# Patient Record
Sex: Female | Born: 1980 | Race: White | Hispanic: No | Marital: Married | Smoking: Never smoker
Health system: Southern US, Community
[De-identification: ages and names within clinical notes are randomized; demographics above are authoritative.]

## PROBLEM LIST (undated history)

## (undated) DIAGNOSIS — E119 Type 2 diabetes mellitus without complications: Secondary | ICD-10-CM

## (undated) HISTORY — PX: TUBAL LIGATION: SHX77

## (undated) HISTORY — PX: CHOLECYSTECTOMY: SHX55

---

## 2004-01-26 ENCOUNTER — Other Ambulatory Visit: Payer: Self-pay

## 2005-09-05 ENCOUNTER — Emergency Department: Payer: Self-pay | Admitting: Emergency Medicine

## 2006-01-19 ENCOUNTER — Observation Stay: Payer: Self-pay

## 2006-02-17 ENCOUNTER — Emergency Department: Payer: Self-pay | Admitting: Emergency Medicine

## 2006-02-17 ENCOUNTER — Observation Stay: Payer: Self-pay | Admitting: Unknown Physician Specialty

## 2006-03-08 ENCOUNTER — Observation Stay: Payer: Self-pay

## 2006-03-21 ENCOUNTER — Observation Stay: Payer: Self-pay | Admitting: Unknown Physician Specialty

## 2006-04-02 ENCOUNTER — Inpatient Hospital Stay: Payer: Self-pay | Admitting: Unknown Physician Specialty

## 2007-10-18 ENCOUNTER — Ambulatory Visit: Payer: Self-pay | Admitting: Obstetrics & Gynecology

## 2007-10-28 ENCOUNTER — Encounter: Payer: Self-pay | Admitting: Maternal & Fetal Medicine

## 2007-11-04 ENCOUNTER — Encounter: Payer: Self-pay | Admitting: Maternal & Fetal Medicine

## 2007-11-11 ENCOUNTER — Encounter: Payer: Self-pay | Admitting: Obstetrics and Gynecology

## 2007-11-15 ENCOUNTER — Observation Stay: Payer: Self-pay

## 2007-11-18 ENCOUNTER — Encounter: Payer: Self-pay | Admitting: Maternal & Fetal Medicine

## 2007-11-25 ENCOUNTER — Encounter: Payer: Self-pay | Admitting: Maternal & Fetal Medicine

## 2007-11-26 ENCOUNTER — Ambulatory Visit: Payer: Self-pay | Admitting: Obstetrics & Gynecology

## 2007-11-29 ENCOUNTER — Observation Stay: Payer: Self-pay | Admitting: Obstetrics and Gynecology

## 2007-11-30 ENCOUNTER — Inpatient Hospital Stay: Payer: Self-pay | Admitting: Obstetrics & Gynecology

## 2014-09-21 ENCOUNTER — Ambulatory Visit: Payer: Self-pay | Admitting: Bariatrics

## 2016-07-26 ENCOUNTER — Emergency Department
Admission: EM | Admit: 2016-07-26 | Discharge: 2016-07-26 | Disposition: A | Discharge status: Home / Self Care | Payer: Managed Care, Other (non HMO) | Attending: Emergency Medicine | Admitting: Emergency Medicine

## 2016-07-26 ENCOUNTER — Encounter: Payer: Self-pay | Admitting: Emergency Medicine

## 2016-07-26 DIAGNOSIS — S39012A Strain of muscle, fascia and tendon of lower back, initial encounter: Secondary | ICD-10-CM

## 2016-07-26 DIAGNOSIS — Y9389 Activity, other specified: Secondary | ICD-10-CM | POA: Diagnosis not present

## 2016-07-26 DIAGNOSIS — Y929 Unspecified place or not applicable: Secondary | ICD-10-CM | POA: Diagnosis not present

## 2016-07-26 DIAGNOSIS — S29002A Unspecified injury of muscle and tendon of back wall of thorax, initial encounter: Secondary | ICD-10-CM | POA: Diagnosis present

## 2016-07-26 DIAGNOSIS — R809 Proteinuria, unspecified: Secondary | ICD-10-CM | POA: Diagnosis not present

## 2016-07-26 DIAGNOSIS — E119 Type 2 diabetes mellitus without complications: Secondary | ICD-10-CM | POA: Insufficient documentation

## 2016-07-26 DIAGNOSIS — S29012A Strain of muscle and tendon of back wall of thorax, initial encounter: Secondary | ICD-10-CM | POA: Diagnosis not present

## 2016-07-26 DIAGNOSIS — X58XXXA Exposure to other specified factors, initial encounter: Secondary | ICD-10-CM | POA: Insufficient documentation

## 2016-07-26 DIAGNOSIS — Y999 Unspecified external cause status: Secondary | ICD-10-CM | POA: Diagnosis not present

## 2016-07-26 HISTORY — DX: Type 2 diabetes mellitus without complications: E11.9

## 2016-07-26 LAB — URINALYSIS, ROUTINE W REFLEX MICROSCOPIC
BILIRUBIN URINE: NEGATIVE
Glucose, UA: 50 mg/dL — AB
KETONES UR: NEGATIVE mg/dL
LEUKOCYTES UA: NEGATIVE
Nitrite: NEGATIVE
Specific Gravity, Urine: 1.01 (ref 1.005–1.030)
pH: 6 (ref 5.0–8.0)

## 2016-07-26 MED ORDER — CYCLOBENZAPRINE HCL 10 MG PO TABS: 10.0000 mg | ORAL_TABLET | Freq: Three times a day (TID) | ORAL | 0 refills | Status: DC | PRN

## 2016-07-26 NOTE — Discharge Instructions (Signed)
Please follow-up with your primary care provider next week for further assessment of proteinuria and regular Diabetes follow-up.

## 2016-07-26 NOTE — ED Provider Notes (Signed)
[facility] Emergency Department Provider Note  ____________________________________________  Time seen: Approximately 7:01 PM  I have reviewed the triage vital signs and the nursing notes.   HISTORY  Chief Complaint Back Pain    HPI Rebecca Martin is a 36 y.o. female , NAD, presents to the emergency department for evaluation of back pain. States she had onset of back pain approximately 3 hours ago while she was completing housework. States she took ibuprofen earlier today for headache and did not want to take anything else on top of that medication and she was concerned about medication side effects. Denies any significant changes in her activities nor any new activities.Denies traumas or falls. Has had no numbness, weakness, tingling, saddle paresthesias nor loss of bowel or bladder control. Denies any abdominal pain, nausea, vomiting, diarrhea, dysuria, hematuria, increased urinary frequency or onset of urinary hesitancy. Patient notes she is a diabetic but unfortunately does not take her medications daily as prescribed and only checks her blood sugars occasionally. Last A1c was greater than 10 and she is due to follow-up with her primary care provider this month to discuss further options of glucose control to include insulin and possible weight loss surgery.   Past Medical History:  Diagnosis Date  . Diabetes mellitus without complication (HCC)     There are no active problems to display for this patient.   Past Surgical History:  Procedure Laterality Date  . CESAREAN SECTION    . CHOLECYSTECTOMY    . TUBAL LIGATION      Prior to Admission medications   Medication Sig Start Date End Date Taking? Authorizing Provider  cyclobenzaprine (FLEXERIL) 10 MG tablet Take 1 tablet (10 mg total) by mouth 3 (three) times daily as needed for muscle spasms. 07/26/16   Jami L Hagler, PA-C    Allergies Penicillins  No family history on file.  Social  History Social History  Substance Use Topics  . Smoking status: Never Smoker  . Smokeless tobacco: Not on file  . Alcohol use Not on file     Review of Systems Constitutional: No fever/chills Eyes: No visual changes.  Cardiovascular: No chest pain. Respiratory: No shortness of breath. No wheezing.  Gastrointestinal: No abdominal pain.  No nausea, vomiting.  No diarrhea.   Genitourinary: Negative for dysuria. No hematuria. No urinary hesitancy, urgency or increased frequency. Musculoskeletal: Positive for back pain.  Skin: Negative for rash, skin sores. Neurological: Negative for headaches, focal weakness or numbness. No tingling. No saddle paresthesias or loss of bowel or bladder control. 10-point ROS otherwise negative.  ____________________________________________   PHYSICAL EXAM:  VITAL SIGNS: ED Triage Vitals  Enc Vitals Group     BP 07/26/16 1809 (!) 169/95     Pulse Rate 07/26/16 1809 72     Resp 07/26/16 1809 20     Temp 07/26/16 1809 97.7 F (36.5 C)     Temp Source 07/26/16 1809 Oral     SpO2 07/26/16 1809 98 %     Weight 07/26/16 1811 260 lb (117.9 kg)     Height 07/26/16 1811 5\' 7"  (1.702 m)     Head Circumference --      Peak Flow --      Pain Score 07/26/16 1811 7     Pain Loc --      Pain Edu? --      Excl. in GC? --     Constitutional: Alert and oriented. Well appearing and in no acute distress. Eyes: Conjunctivae  are normal.  Head: Atraumatic. Neck: Supple with full range of motion. No cervical spine tenderness to palpation. Hematological/Lymphatic/Immunilogical: No cervical lymphadenopathy. Cardiovascular: Normal rate, regular rhythm. Normal S1 and S2.  Good peripheral circulation. Respiratory: Normal respiratory effort without tachypnea or retractions. Lungs CTAB with breath sounds noted in all lung fields. No wheeze, LOC, rales. Musculoskeletal: Full range of motion of lumbar spine without pain or difficulty. No SI joint tenderness bilaterally.  Negative straight leg raise bilaterally. No tenderness to palpation of the thoracic or lumbar Central spinal regions and no step-offs or deformities noted. No lower extremity tenderness nor edema.  No joint effusions. Neurologic:  Normal speech and language. No gross focal neurologic deficits are appreciated.  Skin:  Skin is warm, dry and intact. No rash, redness, swelling, bruising, skin sores noted. Psychiatric: Mood and affect are normal. Speech and behavior are normal. Patient exhibits appropriate insight and judgement.   ____________________________________________   LABS (all labs ordered are listed, but only abnormal results are displayed)  Labs Reviewed  URINALYSIS, ROUTINE W REFLEX MICROSCOPIC - Abnormal; Notable for the following:       Result Value   Color, Urine YELLOW (*)    APPearance CLEAR (*)    Glucose, UA 50 (*)    Hgb urine dipstick MODERATE (*)    Protein, ur >=300 (*)    Bacteria, UA RARE (*)    Squamous Epithelial / LPF 0-5 (*)    All other components within normal limits   ____________________________________________  EKG  None ____________________________________________  RADIOLOGY  None ____________________________________________    PROCEDURES  Procedure(s) performed: None   Procedures   Medications - No data to display   ____________________________________________   INITIAL IMPRESSION / ASSESSMENT AND PLAN / ED COURSE  Pertinent labs & imaging results that were available during my care of the patient were reviewed by me and considered in my medical decision making (see chart for details).  Clinical Course as of Jul 26 2252  Sat Jul 26, 2016  1918 Long discussion had with the patient in regards to urinalysis results and need for compliance with diabetic medications and monitoring. Patient notes that she does not take her diabetic medications on a daily basis and will only take her blood sugars if she feels unwell. Did check her  fasting sugar yesterday which was 218 in which she states is about average for her sugars on a regular basis. Also notes that she regularly follows up every 3 months with her primary care provider and is actually due this month for her recheck. States that her last blood draw in August 2017 revealed normal CBC and metabolic panel. Has had no renal disease or dysfunction in the past. Uncertain if she's ever had a urinalysis completed to assess for proteinuria. Also notes that her LMP began on January 20 and she has continued to spot through today which would account for the moderate hemoglobin in the urine. Patient was offered further evaluation to include lab work and potential CT to evaluate the kidneys but she declines at this time. She states she would prefer to follow-up with her primary care provider to have lab work and imaging is necessary. Did again encourage the patient to be compliant with medication regimen. I am comfortable with discharging the patient home with follow-up with her primary care provider as she seems reliable and well versed in her medical history. Strict return precautions were discussed with the patient in regards to worsening or new onset of symptoms to require  emergency department follow-up.  [JH]    Clinical Course User Index [JH] Jami L Hagler, PA-C    Patient's diagnosis is consistent with Back strain and proteinuria. Patient will be discharged home with prescriptions for Flexeril to take as directed. Patient also advised to continue 600 mg of ibuprofen every 6-8 hours as needed for back pain. Advise patient to complete light range of motion and stretching exercises. Patient is to follow up with her primary care provider for regular diabetic medication management and evaluation as well as if symptoms persist past this treatment course. Patient is given ED precautions to return to the ED for any worsening or new symptoms.     ____________________________________________  FINAL CLINICAL IMPRESSION(S) / ED DIAGNOSES  Final diagnoses:  Back strain, initial encounter  Proteinuria, unspecified type      NEW MEDICATIONS STARTED DURING THIS VISIT:  Discharge Medication List as of 07/26/2016  7:22 PM    START taking these medications   Details  cyclobenzaprine (FLEXERIL) 10 MG tablet Take 1 tablet (10 mg total) by mouth 3 (three) times daily as needed for muscle spasms., Starting Sat 07/26/2016, Print             Ernestene KielJami L ElginHagler, PA-C 07/26/16 2255    Emily FilbertJonathan E Williams, MD 07/26/16 [phone]

## 2016-07-26 NOTE — ED Triage Notes (Signed)
Low back pain began 3 hours ago. No injury, no dysuria.

## 2016-09-05 ENCOUNTER — Other Ambulatory Visit: Payer: Self-pay | Admitting: General Surgery

## 2016-09-12 ENCOUNTER — Ambulatory Visit
Admission: RE | Admit: 2016-09-12 | Discharge: 2016-09-12 | Disposition: A | Discharge status: Home / Self Care | Payer: Managed Care, Other (non HMO) | Source: Ambulatory Visit | Attending: General Surgery | Admitting: General Surgery

## 2016-09-12 ENCOUNTER — Other Ambulatory Visit
Admission: RE | Admit: 2016-09-12 | Discharge: 2016-09-12 | Disposition: A | Discharge status: Home / Self Care | Payer: Managed Care, Other (non HMO) | Source: Ambulatory Visit | Attending: General Surgery | Admitting: General Surgery

## 2016-09-12 DIAGNOSIS — Z01812 Encounter for preprocedural laboratory examination: Secondary | ICD-10-CM | POA: Diagnosis not present

## 2016-09-12 DIAGNOSIS — Z01810 Encounter for preprocedural cardiovascular examination: Secondary | ICD-10-CM | POA: Insufficient documentation

## 2016-09-12 DIAGNOSIS — I498 Other specified cardiac arrhythmias: Secondary | ICD-10-CM | POA: Insufficient documentation

## 2016-09-12 LAB — COMPREHENSIVE METABOLIC PANEL
ALK PHOS: 93 U/L (ref 38–126)
ALT: 18 U/L (ref 14–54)
ANION GAP: 6 (ref 5–15)
AST: 15 U/L (ref 15–41)
Albumin: 4 g/dL (ref 3.5–5.0)
BUN: 15 mg/dL (ref 6–20)
CALCIUM: 8.5 mg/dL — AB (ref 8.9–10.3)
CO2: 25 mmol/L (ref 22–32)
Chloride: 102 mmol/L (ref 101–111)
Creatinine, Ser: 0.55 mg/dL (ref 0.44–1.00)
GFR calc non Af Amer: >60 mL/min (ref >60)
Glucose, Bld: 261 mg/dL — ABNORMAL HIGH (ref 65–99)
Potassium: 4.1 mmol/L (ref 3.5–5.1)
SODIUM: 133 mmol/L — AB (ref 135–145)
TOTAL PROTEIN: 7.7 g/dL (ref 6.5–8.1)
Total Bilirubin: 1.3 mg/dL — ABNORMAL HIGH (ref 0.3–1.2)

## 2016-09-12 LAB — CBC WITH DIFFERENTIAL/PLATELET
Basophils Absolute: 0.1 10*3/uL (ref 0–0.1)
Basophils Relative: 1 %
EOS ABS: 0.2 10*3/uL (ref 0–0.7)
Eosinophils Relative: 2 %
HCT: 37 % (ref 35.0–47.0)
Hemoglobin: 12.7 g/dL (ref 12.0–16.0)
LYMPHS ABS: 2.8 10*3/uL (ref 1.0–3.6)
Lymphocytes Relative: 25 %
MCH: 27.4 pg (ref 26.0–34.0)
MCHC: 34.4 g/dL (ref 32.0–36.0)
MCV: 79.5 fL — ABNORMAL LOW (ref 80.0–100.0)
Monocytes Absolute: 0.4 10*3/uL (ref 0.2–0.9)
Monocytes Relative: 4 %
Neutro Abs: 7.9 10*3/uL — ABNORMAL HIGH (ref 1.4–6.5)
Neutrophils Relative %: 68 %
PLATELETS: 339 10*3/uL (ref 150–440)
RBC: 4.65 MIL/uL (ref 3.80–5.20)
RDW: 14.2 % (ref 11.5–14.5)
WBC: 11.4 10*3/uL — AB (ref 3.6–11.0)

## 2016-09-12 LAB — VITAMIN B12: Vitamin B-12: 388 pg/mL (ref 180–914)

## 2016-09-12 LAB — TSH: TSH: 1.799 u[IU]/mL (ref 0.350–4.500)

## 2016-09-12 LAB — IRON: Iron: 53 ug/dL (ref 28–170)

## 2016-09-13 LAB — HEMOGLOBIN A1C
Hgb A1c MFr Bld: 9 % — ABNORMAL HIGH (ref 4.8–5.6)
MEAN PLASMA GLUCOSE: 212 mg/dL

## 2016-09-14 LAB — H.PYLORI BREATH TEST (REFLEX): H. PYLORI BREATH TEST: NEGATIVE

## 2016-09-14 LAB — H. PYLORI BREATH TEST

## 2016-09-15 LAB — CALCITRIOL (1,25 DI-OH VIT D): Vit D, 1,25-Dihydroxy: 51.6 pg/mL (ref 19.9–79.3)

## 2016-10-08 ENCOUNTER — Encounter: Payer: Managed Care, Other (non HMO) | Attending: General Surgery | Admitting: Skilled Nursing Facility1

## 2016-10-08 ENCOUNTER — Encounter: Payer: Self-pay | Admitting: Skilled Nursing Facility1

## 2016-10-08 DIAGNOSIS — E119 Type 2 diabetes mellitus without complications: Secondary | ICD-10-CM | POA: Diagnosis not present

## 2016-10-08 DIAGNOSIS — Z7984 Long term (current) use of oral hypoglycemic drugs: Secondary | ICD-10-CM | POA: Insufficient documentation

## 2016-10-08 DIAGNOSIS — Z6841 Body Mass Index (BMI) 40.0 and over, adult: Secondary | ICD-10-CM | POA: Insufficient documentation

## 2016-10-08 DIAGNOSIS — Z713 Dietary counseling and surveillance: Secondary | ICD-10-CM | POA: Diagnosis not present

## 2016-10-08 NOTE — Patient Instructions (Addendum)
-   Check blood sugar at least once a day. Keep a log of blood sugar numbers.   - Practice not drinking 15 minutes before, during, and 30 minutes after each meal/snack  - Avoid all carbonated beverages

## 2016-10-08 NOTE — Progress Notes (Signed)
Pre-Op Assessment Visit:  Pre-Operative Sleeve gastrectomy Surgery  Patient was seen on 10/08/2016 for Pre-Operative Nutrition Assessment. Assessment and letter of approval faxed to [facility] Bariatric Surgery Program coordinator on 10/08/2016.  Pt expectation of surgery: to have managed diabetes A1c <7,   Pt expectation of Dietitian: needs creative substitutions for picky eating  Start weight at [facility]: 256.6# BMI: 40.84  Pt states motivated to be able to do and be active with children while they play. Pt states that she may consider not having the surgery if she can control her diabetes. Pt states she Doesn't like eggs, cheese, cooked spinach, peas, most greens, asparagus, and pinto beans. Pt states she is willing to try different foods except eggs and cheese. Pt was happy to receive diabetes education. Pt is an Systems developer and likes science/numbers.    24 hr Dietary Recall: First Meal: Premier protein Snack:  oikos triple zero Second Meal: salad, wrap, sandwich, chips, wheat thins Snack: baked chips, fruit Third Meal: spaghetti, chicken pie, sloppy joe, chicken  Snack: popcorn, chips Beverages: water (plain, flavored, sparkling), coffee, sugar-free creamer  Encouraged to engage in  minutes of moderate physical activity including cardiovascular and weight baring weekly Goals: - Check blood sugar at least once a day. Keep a log of blood sugar numbers.  - Practice not drinking 15 minutes before, during, and 30 minutes after each meal/snack - Avoid all carbonated beverages  Handouts given during visit include:  . Pre-Op Goals . Bariatric Surgery Protein Shakes During the appointment today the following Pre-Op Goals were reviewed with the patient: . Maintain or lose weight as instructed by your surgeon . Make healthy food choices . Begin to limit portion sizes . Limited concentrated sugars and fried foods . Keep fat/sugar in the single digits per serving on           food  labels . Practice CHEWING your food  (aim for 30 chews per bite or until applesauce consistency) . Practice not drinking 15 minutes before, during, and 30 minutes after each meal/snack . Avoid all carbonated beverages  . Avoid/limit caffeinated beverages  . Avoid all sugar-sweetened beverages . Consume 3 meals per day; eat every 3-5 hours . Make a list of non-food related activities . Aim for 64-100 ounces of FLUID daily  . Aim for at least 60-80 grams of PROTEIN daily . Look for a liquid protein source that contain ?15 g protein and ?5 g carbohydrate  (ex: shakes, drinks, shots)  -Follow diet recommendations listed below   Energy and Macronutrient Recomendations: Calories: 1800 Carbohydrate: 200 Protein: 135 Fat: 50  Demonstrated degree of understanding via:  Teach Back  Teaching Method Utilized:  Visual Auditory Hands on  Barriers to learning/adherence to lifestyle change: none identified   Patient to call the [facility] to enroll in Pre-Op and Post-Op Nutrition Education when surgery date is scheduled.

## 2016-11-12 ENCOUNTER — Ambulatory Visit: Payer: Managed Care, Other (non HMO) | Admitting: Skilled Nursing Facility1

## 2016-11-18 ENCOUNTER — Ambulatory Visit (INDEPENDENT_AMBULATORY_CARE_PROVIDER_SITE_OTHER): Payer: Managed Care, Other (non HMO) | Admitting: Psychiatry

## 2016-11-18 ENCOUNTER — Encounter: Payer: Self-pay | Admitting: Registered"

## 2016-11-18 ENCOUNTER — Encounter: Payer: Managed Care, Other (non HMO) | Attending: General Surgery | Admitting: Registered"

## 2016-11-18 DIAGNOSIS — Z6841 Body Mass Index (BMI) 40.0 and over, adult: Secondary | ICD-10-CM | POA: Diagnosis not present

## 2016-11-18 DIAGNOSIS — E119 Type 2 diabetes mellitus without complications: Secondary | ICD-10-CM | POA: Insufficient documentation

## 2016-11-18 DIAGNOSIS — F509 Eating disorder, unspecified: Secondary | ICD-10-CM | POA: Diagnosis not present

## 2016-11-18 DIAGNOSIS — Z713 Dietary counseling and surveillance: Secondary | ICD-10-CM | POA: Diagnosis not present

## 2016-11-18 DIAGNOSIS — Z7984 Long term (current) use of oral hypoglycemic drugs: Secondary | ICD-10-CM | POA: Diagnosis not present

## 2016-11-18 NOTE — Progress Notes (Signed)
Appt start time: 12:07 end time: 12:24  Assessment: 1st SWL Appointment.   Start Wt at [facility]: 256.6 Wt: 255.5 BMI: 40.62   Pt arrives having lost 1.1 lbs from previous visit.   Pt states she has cut back on carbonation and drinking  90% water. Pt states she she has cut out frying food. Pt reports having an 37yo son that she's concerned about weight issues and wants to create better habits for him and the rest of the family as well. Pt states she has been working on not drinking with meals. Pt states she checks her BS 3 times a wk: FBS (around 200).  MEDICATIONS: See list   DIETARY INTAKE:  24-hr recall:  B ( AM): Protein shake  Snk ( AM): greek yogurt  L ( PM): sandwich, wrap, salad, with fruit Snk ( PM): none D ( PM): spaghetti and salad Snk ( PM): crackers (sometimes) Beverages: water, sparkling water   Usual physical activity: walk occasionally  Diet to Follow: Calories: 1800 Carbohydrate: 200 Protein: 135 Fat: 50  Preferred Learning Style:   No preference indicated   Learning Readiness:   Contemplating  Ready  Change in progress     Nutritional Diagnosis:  -3.3 Overweight/obesity related to past poor dietary habits and physical inactivity as evidenced by patient w/ planned sleeve gastrectomy surgery following dietary guidelines for continued weight loss.    Intervention:  Nutrition counseling for upcoming Bariatric Surgery.  Goals:  - Aim to check blood sugar at least once a day: fasting and 2 hrs after meals. Keep meter on you. - Increase physical activity to at least 15 min a day. Wear fitness watch and set alarms to tell you to move throughout your day.  Teaching Method Utilized:  Visual Auditory   Handouts given during visit include:  none   Barriers to learning/adherence to lifestyle change: none  Demonstrated degree of understanding via:  Teach Back   Monitoring/Evaluation:  Dietary intake, exercise, and body weight in 1 month(s).

## 2016-11-18 NOTE — Patient Instructions (Addendum)
-   Aim to check blood sugar at least once a day: fasting and 2 hrs after meals. Keep meter on you.  - Increase physical activity to at least 15 min a day. Wear fitness watch and set alarms to tell you to move throughout your day.

## 2016-12-16 ENCOUNTER — Ambulatory Visit: Payer: Managed Care, Other (non HMO) | Admitting: Psychiatry

## 2016-12-19 ENCOUNTER — Encounter: Payer: Managed Care, Other (non HMO) | Attending: General Surgery | Admitting: Registered"

## 2016-12-19 ENCOUNTER — Encounter: Payer: Self-pay | Admitting: Registered"

## 2016-12-19 DIAGNOSIS — Z6841 Body Mass Index (BMI) 40.0 and over, adult: Secondary | ICD-10-CM | POA: Diagnosis not present

## 2016-12-19 DIAGNOSIS — Z713 Dietary counseling and surveillance: Secondary | ICD-10-CM | POA: Insufficient documentation

## 2016-12-19 DIAGNOSIS — Z7984 Long term (current) use of oral hypoglycemic drugs: Secondary | ICD-10-CM | POA: Insufficient documentation

## 2016-12-19 DIAGNOSIS — E119 Type 2 diabetes mellitus without complications: Secondary | ICD-10-CM | POA: Insufficient documentation

## 2016-12-19 NOTE — Patient Instructions (Addendum)
-   Aim to check blood sugar at least 3-4 times a day: fasting and 2 hours after meals. Track numbers.   - Find a fitness class that you enjoy while schedule is lighter this summer in addition to walking routine.

## 2016-12-19 NOTE — Progress Notes (Signed)
Appt start time: 11:05 end time: 11:25  Assessment: 2nd SWL Appointment.   Start Wt at [facility]: 256.6 Wt: 257.4 BMI: 40.92   Pt arrives having gained 1.9 lbs from previous visit. Pt states she checks BS at least 2x times/day: FBS (160-170; sometimes 200) after meals (250). Pt states she has reduced fried food and grills/bakes meat.Pt states portion control is not good at night. Pt reports her family (has 4 children) likes apples and fruit especially since they are in season now. Pt reports she is working on increasing walking and trying to sign up for fitness/cardio-dance class during summer break. Pt states she is still undecided about surgery and would like to continue with nutrition visits due to accountability and motivation.   Pt states she may have 1 more required visit with us.     MEDICATIONS: See list   DIETARY INTAKE:  24-hr recall:  B ( AM): Protein shake or triple zero yogurt Snk ( AM): greek yogurt  L ( PM): sandwich, wrap, salad, with fruit Snk ( PM): none D ( PM): meat, mashed potatoes, and vegetables  Snk ( PM): crackers, fruit, greek yogurt Beverages: water, sparkling water   Usual physical activity: walks occasionally  Diet to Follow: Calories: 1800 Carbohydrate: 200 Protein: 135 Fat: 50  Preferred Learning Style:   No preference indicated   Learning Readiness:   Contemplating  Ready  Change in progress     Nutritional Diagnosis:  [place]-3.3 Overweight/obesity related to past poor dietary habits and physical inactivity as evidenced by patient w/ planned sleeve gastrectomy surgery following dietary guidelines for continued weight loss.    Intervention:  Nutrition counseling for upcoming Bariatric Surgery.  Goals:  - Aim to check blood sugar at least 3-4 times a day: fasting and 2 hours after meals. Track numbers.  - Find a fitness class that you enjoy while schedule is lighter this summer in addition to walking routine.  Teaching Method Utilized:   Visual Auditory   Handouts given during visit include:  none   Barriers to learning/adherence to lifestyle change: none  Demonstrated degree of understanding via:  Teach Back   Monitoring/Evaluation:  Dietary intake, exercise, and body weight in 1 month(s).

## 2017-01-16 ENCOUNTER — Ambulatory Visit: Payer: Self-pay | Admitting: Registered"

## 2017-01-16 NOTE — Progress Notes (Deleted)
Appt start time: 11:05 end time: ***  Assessment: 3rd SWL Appointment.   Start Wt at [facility]: 256.6 Wt: 257.4,  BMI: 40.92   Pt arrives having gained 1.9 lbs from previous visit. Pt states she checks BS at least 2x times/day: FBS (160-170; sometimes 200) after meals (250). Pt states she has reduced fried food and grills/bakes meat.Pt states portion control is not good at night. Pt reports her family (has 4 children) likes apples and fruit especially since they are in season now. Pt reports she is working on increasing walking and trying to sign up for fitness/cardio-dance class during summer break. Pt states she is still undecided about surgery and would like to continue with nutrition visits due to accountability and motivation.   Pt states she may have 1 more required visit with us.     MEDICATIONS: See list   DIETARY INTAKE:  24-hr recall:  B ( AM): Protein shake or triple zero yogurt Snk ( AM): greek yogurt  L ( PM): sandwich, wrap, salad, with fruit Snk ( PM): none D ( PM): meat, mashed potatoes, and vegetables  Snk ( PM): crackers, fruit, greek yogurt Beverages: water, sparkling water   Usual physical activity: walks occasionally  Diet to Follow: Calories: 1800 Carbohydrate: 200 Protein: 135 Fat: 50  Preferred Learning Style:   No preference indicated   Learning Readiness:   Contemplating  Ready  Change in progress     Nutritional Diagnosis:  [place]-3.3 Overweight/obesity related to past poor dietary habits and physical inactivity as evidenced by patient w/ planned sleeve gastrectomy surgery following dietary guidelines for continued weight loss.    Intervention:  Nutrition counseling for upcoming Bariatric Surgery.  Goals:  - Aim to check blood sugar at least 3-4 times a day: fasting and 2 hours after meals. Track numbers.  - Find a fitness class that you enjoy while schedule is lighter this summer in addition to walking routine.  Teaching Method Utilized:   Visual Auditory   Handouts given during visit include:  none   Barriers to learning/adherence to lifestyle change: none  Demonstrated degree of understanding via:  Teach Back   Monitoring/Evaluation:  Dietary intake, exercise, and body weight in 1 month(s).

## 2017-01-21 ENCOUNTER — Encounter: Payer: Managed Care, Other (non HMO) | Attending: General Surgery | Admitting: Registered"

## 2017-01-21 ENCOUNTER — Encounter: Payer: Self-pay | Admitting: Registered"

## 2017-01-21 DIAGNOSIS — Z713 Dietary counseling and surveillance: Secondary | ICD-10-CM | POA: Diagnosis not present

## 2017-01-21 DIAGNOSIS — E119 Type 2 diabetes mellitus without complications: Secondary | ICD-10-CM | POA: Insufficient documentation

## 2017-01-21 DIAGNOSIS — Z7984 Long term (current) use of oral hypoglycemic drugs: Secondary | ICD-10-CM | POA: Insufficient documentation

## 2017-01-21 DIAGNOSIS — Z6841 Body Mass Index (BMI) 40.0 and over, adult: Secondary | ICD-10-CM | POA: Insufficient documentation

## 2017-01-21 NOTE — Progress Notes (Signed)
Appt start time: 8:35 end time: 8:50  Assessment: 3rd SWL Appointment.   Start Wt at [facility]: 256.6 Wt: 257.7 BMI: 40.92   Pt arrives having maintained weight from previous visit.  Pt states she checks BS at least 2x times/day: FBS (190-200) after meals (225). Pt states work has been busy lately with system going down. Pt reports she does not eat regularly; higher BS numbers during the day. Pt states her lunch over the past few weeks she will only eat 1/2 salad, then rush to do some work and eat 1/2 salad later. Pt reports she has been stress-eating recently; eats wheat thins instead of cookies and cakes. Pt states she has looked into fitness centers and plans to [facility] in the next couple of weeks. Pt states she has eliminated carbonation; no sparkling water. Pt states she has decided to go through with surgery because she has reached a plateau and unable to lose weight on her own.   This is pt's last visit with us prior to surgery.    MEDICATIONS: See list   DIETARY INTAKE:  24-hr recall:  B ( AM): Protein shake or triple zero yogurt Snk ( AM): greek yogurt  L ( PM): sandwich, wrap, salad w/ grilled chicken, with fruit Snk ( PM): none D ( PM): meat, mashed potatoes, and vegetables  Snk ( PM): crackers, fruit, greek yogurt Beverages: water  Usual physical activity: walks occasionally  Diet to Follow: Calories: 1800 Carbohydrate: 200 Protein: 135 Fat: 50  Preferred Learning Style:   No preference indicated   Learning Readiness:   Contemplating  Ready  Change in progress     Nutritional Diagnosis:  [place]-3.3 Overweight/obesity related to past poor dietary habits and physical inactivity as evidenced by patient w/ planned sleeve gastrectomy surgery following dietary guidelines for continued weight loss.    Intervention:  Nutrition counseling for upcoming Bariatric Surgery.  Goals:  - Reach out to co-worker to incorporate walking during work day.  - Aim to have  physical activity at least 30 min/day, 5 days a week.  - Continue to aim to chew well; at least 30 times per bite or to applesauce consistency. - Aim to not drink with meals. No drinking 15 min prior to eating, not while eating, and waiting 30 after eating to drink.   Teaching Method Utilized:  Visual Auditory   Handouts given during visit include:  101 Things to Do Besides Eat   Barriers to learning/adherence to lifestyle change: none  Demonstrated degree of understanding via:  Teach Back   Monitoring/Evaluation:  Dietary intake, exercise, and body weight prn.

## 2017-01-21 NOTE — Patient Instructions (Addendum)
-   Reach out to co-worker to incorporate walking during work day.   - Aim to have physical activity at least 30 min/day, 5 days a week.   - Continue to aim to chew well; at least 30 times per bite or to applesauce consistency.  - Aim to not drink with meals. No drinking 15 min prior to eating, not while eating, and waiting 30 after eating to drink.

## 2017-02-02 ENCOUNTER — Ambulatory Visit: Payer: Self-pay | Admitting: Skilled Nursing Facility1

## 2017-02-13 ENCOUNTER — Ambulatory Visit: Payer: Self-pay | Admitting: General Surgery

## 2017-02-13 NOTE — Progress Notes (Signed)
Need orders in epic for 8-27 surgery

## 2017-02-16 ENCOUNTER — Encounter: Payer: Managed Care, Other (non HMO) | Attending: General Surgery | Admitting: Registered"

## 2017-02-16 ENCOUNTER — Ambulatory Visit: Payer: Self-pay | Admitting: General Surgery

## 2017-02-16 DIAGNOSIS — Z713 Dietary counseling and surveillance: Secondary | ICD-10-CM | POA: Insufficient documentation

## 2017-02-16 DIAGNOSIS — E119 Type 2 diabetes mellitus without complications: Secondary | ICD-10-CM | POA: Diagnosis not present

## 2017-02-16 DIAGNOSIS — Z6841 Body Mass Index (BMI) 40.0 and over, adult: Secondary | ICD-10-CM | POA: Diagnosis not present

## 2017-02-16 DIAGNOSIS — E669 Obesity, unspecified: Secondary | ICD-10-CM

## 2017-02-16 DIAGNOSIS — Z7984 Long term (current) use of oral hypoglycemic drugs: Secondary | ICD-10-CM | POA: Insufficient documentation

## 2017-02-16 NOTE — Progress Notes (Signed)
  Pre-Operative Nutrition Class:  Appt start time: 8:15  End time:  9:15  Patient was seen on 02/16/2017 for Pre-Operative Bariatric Surgery [facility]   Surgery date: 02/24/2017 Surgery type: Sleeve gastrectomy Start weight at [facility]: 256.6 Weight today: 257.1   Samples given per MNT protocol. Patient educated on appropriate usage: Bariatric Advantage Multivitamin Lot # [id number] Exp: 11/2017  Bariatric Advantage Calcium Citrate Lot # [id number] Exp: 03/25/2017  Renee Pain Protein Powder Lot # [id number] Exp: 08/2016  The following the learning objectives were met by the patient during this course:  Identify Pre-Op Dietary Goals and will begin 2 weeks pre-operatively  Identify appropriate sources of fluids and proteins   State protein recommendations and appropriate sources pre and post-operatively  Identify Post-Operative Dietary Goals and will follow for 2 weeks post-operatively  Identify appropriate multivitamin and calcium sources  Describe the need for physical activity post-operatively and will follow MD recommendations  State when to call healthcare provider regarding medication questions or post-operative complications  Handouts given during class include:  Pre-Op Bariatric Surgery Diet Handout  Protein Shake Handout  Post-Op Bariatric Surgery Nutrition Handout  [facility] Program Information Flyer  Support Group Information Flyer  [facility] Bariatric Supplements Price List  Follow-Up Plan: Patient will follow-up at [facility] 2 weeks post operatively for diet advancement per MD.

## 2017-02-19 NOTE — Patient Instructions (Addendum)
Rebecca Martin  02/19/2017   Your procedure is scheduled on: 02-24-17  Report to [facility] Main  Entrance Take [place]  elevators to 3rd floor to  Short Stay Center at 8:45AM.    Call this number if you have problems the morning of surgery [phone]    Remember: ONLY 1 PERSON MAY GO WITH YOU TO SHORT STAY TO GET  READY MORNING OF YOUR SURGERY.  Do not eat food or drink liquids :After Midnight.     Take these medicines the morning of surgery with A SIP OF WATER: tylenol if needed                                You may not have any metal on your body including hair pins and              piercings  Do not wear jewelry, make-up, lotions, powders or perfumes, deodorant             Do not wear nail polish.  Do not shave  48 hours prior to surgery.             Do not bring valuables to the hospital. [facility] IS NOT             RESPONSIBLE   FOR VALUABLES.  Contacts, dentures or bridgework may not be worn into surgery.  Leave suitcase in the car. After surgery it may be brought to your room.                Please read over the following fact sheets you were given: _____________________________________________________________________  How to Manage Your Diabetes Before and After Surgery  Why is it important to control my blood sugar before and after surgery? . Improving blood sugar levels before and after surgery helps healing and can limit problems. . A way of improving blood sugar control is eating a healthy diet by: o  Eating less sugar and carbohydrates o  Increasing activity/exercise o  Talking with your doctor about reaching your blood sugar goals . High blood sugars (greater than 180 mg/dL) can raise your risk of infections and slow your recovery, so you will need to focus on controlling your diabetes during the weeks before surgery. . Make sure that the doctor who takes care of your diabetes knows about your planned surgery including the date and  location.  How do I manage my blood sugar before surgery? . Check your blood sugar at least 4 times a day, starting 2 days before surgery, to make sure that the level is not too high or low. o Check your blood sugar the morning of your surgery when you wake up and every 2 hours until you get to the Short Stay unit. . If your blood sugar is less than 70 mg/dL, you will need to treat for low blood sugar: o Do not take insulin. o Treat a low blood sugar (less than 70 mg/dL) with  cup of clear juice (cranberry or apple), 4 glucose tablets, OR glucose gel. o Recheck blood sugar in 15 minutes after treatment (to make sure it is greater than 70 mg/dL). If your blood sugar is not greater than 70 mg/dL on recheck, call [phone] for further instructions. . Report your blood sugar to the short stay nurse when you get to Short Stay.  Marland Kitchen  If you are admitted to the hospital after surgery: o Your blood sugar will be checked by the staff and you will probably be given insulin after surgery (instead of oral diabetes medicines) to make sure you have good blood sugar levels. o The goal for blood sugar control after surgery is 80-180 mg/dL.    Patient Signature:  Date:   Nurse Signature:  Date:   Reviewed and Endorsed by [facility] Patient Education Committee, August 2015             [facility] - Preparing for Surgery Before surgery, you can play an important role.  Because skin is not sterile, your skin needs to be as free of germs as possible.  You can reduce the number of germs on your skin by washing with CHG (chlorahexidine gluconate) soap before surgery.  CHG is an antiseptic cleaner which kills germs and bonds with the skin to continue killing germs even after washing. Please DO NOT use if you have an allergy to CHG or antibacterial soaps.  If your skin becomes reddened/irritated stop using the CHG and inform your nurse when you arrive at Short Stay. Do not shave (including legs and underarms)  for at least 48 hours prior to the first CHG shower.  You may shave your face/neck. Please follow these instructions carefully:  1.  Shower with CHG Soap the night before surgery and the  morning of Surgery.  2.  If you choose to wash your hair, wash your hair first as usual with your  normal  shampoo.  3.  After you shampoo, rinse your hair and body thoroughly to remove the  shampoo.                           4.  Use CHG as you would any other liquid soap.  You can apply chg directly  to the skin and wash                       Gently with a scrungie or clean washcloth.  5.  Apply the CHG Soap to your body ONLY FROM THE NECK DOWN.   Do not use on face/ open                           Wound or open sores. Avoid contact with eyes, ears mouth and genitals (private parts).                       Wash face,  Genitals (private parts) with your normal soap.             6.  Wash thoroughly, paying special attention to the area where your surgery  will be performed.  7.  Thoroughly rinse your body with warm water from the neck down.  8.  DO NOT shower/wash with your normal soap after using and rinsing off  the CHG Soap.                9.  Pat yourself dry with a clean towel.            10.  Wear clean pajamas.            11.  Place clean sheets on your bed the night of your first shower and do not  sleep with pets. Day of Surgery : Do not apply any lotions/deodorants  the morning of surgery.  Please wear clean clothes to the hospital/surgery center.  FAILURE TO FOLLOW THESE INSTRUCTIONS MAY RESULT IN THE CANCELLATION OF YOUR SURGERY PATIENT SIGNATURE_________________________________  NURSE SIGNATURE__________________________________  ________________________________________________________________________   Rebecca Martin  An incentive spirometer is a tool that can help keep your lungs clear and active. This tool measures how well you are filling your lungs with each breath. Taking long deep  breaths may help reverse or decrease the chance of developing breathing (pulmonary) problems (especially infection) following:  A long period of time when you are unable to move or be active. BEFORE THE PROCEDURE   If the spirometer includes an indicator to show your best effort, your nurse or respiratory therapist will set it to a desired goal.  If possible, sit up straight or lean slightly forward. Try not to slouch.  Hold the incentive spirometer in an upright position. INSTRUCTIONS FOR USE  1. Sit on the edge of your bed if possible, or sit up as far as you can in bed or on a chair. 2. Hold the incentive spirometer in an upright position. 3. Breathe out normally. 4. Place the mouthpiece in your mouth and seal your lips tightly around it. 5. Breathe in slowly and as deeply as possible, raising the piston or the ball toward the top of the column. 6. Hold your breath for 3-5 seconds or for as long as possible. Allow the piston or ball to fall to the bottom of the column. 7. Remove the mouthpiece from your mouth and breathe out normally. 8. Rest for a few seconds and repeat Steps 1 through 7 at least 10 times every 1-2 hours when you are awake. Take your time and take a few normal breaths between deep breaths. 9. The spirometer may include an indicator to show your best effort. Use the indicator as a goal to work toward during each repetition. 10. After each set of 10 deep breaths, practice coughing to be sure your lungs are clear. If you have an incision (the cut made at the time of surgery), support your incision when coughing by placing a pillow or rolled up towels firmly against it. Once you are able to get out of bed, walk around indoors and cough well. You may stop using the incentive spirometer when instructed by your caregiver.  RISKS AND COMPLICATIONS  Take your time so you do not get dizzy or light-headed.  If you are in pain, you may need to take or ask for pain medication before  doing incentive spirometry. It is harder to take a deep breath if you are having pain. AFTER USE  Rest and breathe slowly and easily.  It can be helpful to keep track of a log of your progress. Your caregiver can provide you with a simple table to help with this. If you are using the spirometer at home, follow these instructions: SEEK MEDICAL CARE IF:   You are having difficultly using the spirometer.  You have trouble using the spirometer as often as instructed.  Your pain medication is not giving enough relief while using the spirometer.  You develop fever of 100.5 F (38.1 C) or higher. SEEK IMMEDIATE MEDICAL CARE IF:   You cough up bloody sputum that had not been present before.  You develop fever of 102 F (38.9 C) or greater.  You develop worsening pain at or near the incision site. MAKE SURE YOU:   Understand these instructions.  Will watch your condition.  Will get help  right away if you are not doing well or get worse. Document Released: 10/27/2006 Document Revised: 09/08/2011 Document Reviewed: 12/28/2006 [facility] Patient Information 2014 [place], [place].   ________________________________________________________________________

## 2017-02-19 NOTE — Progress Notes (Signed)
CXR, EKG 09-12-16 epic   LOV PCP dr Larwance Sachs  At [facility] clinic 03-28-16 chart

## 2017-02-20 ENCOUNTER — Encounter (HOSPITAL_COMMUNITY)
Admission: RE | Admit: 2017-02-20 | Discharge: 2017-02-20 | Disposition: A | Discharge status: Home / Self Care | Payer: Managed Care, Other (non HMO) | Source: Ambulatory Visit | Attending: General Surgery | Admitting: General Surgery

## 2017-02-20 ENCOUNTER — Encounter (HOSPITAL_COMMUNITY): Payer: Self-pay | Admitting: Emergency Medicine

## 2017-02-20 DIAGNOSIS — Z01812 Encounter for preprocedural laboratory examination: Secondary | ICD-10-CM | POA: Diagnosis not present

## 2017-02-20 LAB — COMPREHENSIVE METABOLIC PANEL
ALBUMIN: 3.6 g/dL (ref 3.5–5.0)
ALT: 27 U/L (ref 14–54)
ANION GAP: 7 (ref 5–15)
AST: 18 U/L (ref 15–41)
Alkaline Phosphatase: 90 U/L (ref 38–126)
BILIRUBIN TOTAL: 1.3 mg/dL — AB (ref 0.3–1.2)
BUN: 15 mg/dL (ref 6–20)
CO2: 26 mmol/L (ref 22–32)
Calcium: 8.8 mg/dL — ABNORMAL LOW (ref 8.9–10.3)
Chloride: 102 mmol/L (ref 101–111)
Creatinine, Ser: 0.51 mg/dL (ref 0.44–1.00)
GFR calc non Af Amer: >60 mL/min (ref >60)
GLUCOSE: 230 mg/dL — AB (ref 65–99)
POTASSIUM: 4.1 mmol/L (ref 3.5–5.1)
SODIUM: 135 mmol/L (ref 135–145)
TOTAL PROTEIN: 7.5 g/dL (ref 6.5–8.1)

## 2017-02-20 LAB — CBC WITH DIFFERENTIAL/PLATELET
BASOS PCT: 0 %
Basophils Absolute: 0 10*3/uL (ref 0.0–0.1)
EOS ABS: 0.2 10*3/uL (ref 0.0–0.7)
Eosinophils Relative: 2 %
HEMATOCRIT: 39.3 % (ref 36.0–46.0)
Hemoglobin: 13.2 g/dL (ref 12.0–15.0)
Lymphocytes Relative: 25 %
Lymphs Abs: 2.8 10*3/uL (ref 0.7–4.0)
MCH: 26.9 pg (ref 26.0–34.0)
MCHC: 33.6 g/dL (ref 30.0–36.0)
MCV: 80 fL (ref 78.0–100.0)
MONO ABS: 0.4 10*3/uL (ref 0.1–1.0)
MONOS PCT: 3 %
Neutro Abs: 7.7 10*3/uL (ref 1.7–7.7)
Neutrophils Relative %: 70 %
Platelets: 327 10*3/uL (ref 150–400)
RBC: 4.91 MIL/uL (ref 3.87–5.11)
RDW: 13.5 % (ref 11.5–15.5)
WBC: 11.2 10*3/uL — ABNORMAL HIGH (ref 4.0–10.5)

## 2017-02-20 LAB — HEMOGLOBIN A1C
HEMOGLOBIN A1C: 8.9 % — AB (ref 4.8–5.6)
Mean Plasma Glucose: 208.73 mg/dL

## 2017-02-20 NOTE — Progress Notes (Addendum)
CMP result and nurse note routed via epic to surgeon  Dr Sheliah Hatch

## 2017-02-20 NOTE — Progress Notes (Signed)
Pt at pre-op admits to nurse that she has not taken  her diabetes medication metformin  in over a month d/t forgetting and busy schedule then eventually ran out and didn't pick up new rx. Pt states that she still monitors her blood sugars at home and runs fasting sugars avg 180s . However last a1c in epic was 9.0 on 09-12-16.RN counseled patient on the importance of proper diabetes control with medication and diet and exercise  especially in the light of surgery.  RN informed patient that a1c would be collected today and if abnormal, RN would notify surgeon for her safety and this may potentially delay surgery. Written information also supplied to pt on importance of DM control for surgery . Pt verbalized understanding .

## 2017-02-20 NOTE — Progress Notes (Signed)
hga1c routed via epic to Dr Sheliah Hatch

## 2017-02-20 NOTE — Progress Notes (Signed)
RN called and spoke to April in nursing traige at [facility] tpo inform of pt non adherence to diabetes medication and elevated HgA1C of 8.9 . April to get pass message on to Dr Sheliah Hatch

## 2017-02-23 MED ORDER — GENTAMICIN SULFATE 40 MG/ML IJ SOLN
5.0000 mg/kg | INTRAMUSCULAR | Status: DC
  Filled 2017-02-23: qty 10.5

## 2017-02-27 NOTE — Progress Notes (Signed)
Left voice mail message for patient to arrive at 0915 on 03/03/17 and surgery at 1115am.  Instructed patient in voice mail message to call back at [phone] to let us know she received message of time change.

## 2017-03-02 MED ORDER — GENTAMICIN SULFATE 40 MG/ML IJ SOLN
5.0000 mg/kg | INTRAMUSCULAR | Status: AC
  Administered 2017-03-03: 420 mg via INTRAVENOUS
  Filled 2017-03-02 (×2): qty 10.5

## 2017-03-03 ENCOUNTER — Encounter (HOSPITAL_COMMUNITY): Payer: Self-pay | Admitting: *Deleted

## 2017-03-03 ENCOUNTER — Inpatient Hospital Stay (HOSPITAL_COMMUNITY)
Admission: RE | Admit: 2017-03-03 | Discharge: 2017-03-04 | DRG: 621 | Disposition: A | Discharge status: Home / Self Care | Payer: Managed Care, Other (non HMO) | Source: Ambulatory Visit | Attending: General Surgery | Admitting: General Surgery

## 2017-03-03 ENCOUNTER — Inpatient Hospital Stay (HOSPITAL_COMMUNITY): Payer: Managed Care, Other (non HMO) | Admitting: Certified Registered Nurse Anesthetist

## 2017-03-03 ENCOUNTER — Encounter (HOSPITAL_COMMUNITY)
Admission: RE | Disposition: A | Discharge status: Home / Self Care | Payer: Self-pay | Source: Ambulatory Visit | Attending: General Surgery

## 2017-03-03 DIAGNOSIS — Z880 Allergy status to penicillin: Secondary | ICD-10-CM | POA: Diagnosis not present

## 2017-03-03 DIAGNOSIS — E119 Type 2 diabetes mellitus without complications: Secondary | ICD-10-CM | POA: Diagnosis present

## 2017-03-03 DIAGNOSIS — Z7984 Long term (current) use of oral hypoglycemic drugs: Secondary | ICD-10-CM

## 2017-03-03 DIAGNOSIS — Z6841 Body Mass Index (BMI) 40.0 and over, adult: Secondary | ICD-10-CM | POA: Diagnosis not present

## 2017-03-03 HISTORY — PX: LAPAROSCOPIC GASTRIC SLEEVE RESECTION: SHX5895

## 2017-03-03 LAB — CBC WITH DIFFERENTIAL/PLATELET
BASOS ABS: 0 10*3/uL (ref 0.0–0.1)
Basophils Relative: 0 %
EOS ABS: 0.1 10*3/uL (ref 0.0–0.7)
Eosinophils Relative: 1 %
HCT: 34.5 % — ABNORMAL LOW (ref 36.0–46.0)
Hemoglobin: 11.6 g/dL — ABNORMAL LOW (ref 12.0–15.0)
Lymphocytes Relative: 20 %
Lymphs Abs: 2.5 10*3/uL (ref 0.7–4.0)
MCH: 26.7 pg (ref 26.0–34.0)
MCHC: 33.6 g/dL (ref 30.0–36.0)
MCV: 79.5 fL (ref 78.0–100.0)
MONO ABS: 0.2 10*3/uL (ref 0.1–1.0)
Monocytes Relative: 2 %
Neutro Abs: 9.5 10*3/uL — ABNORMAL HIGH (ref 1.7–7.7)
Neutrophils Relative %: 77 %
PLATELETS: 314 10*3/uL (ref 150–400)
RBC: 4.34 MIL/uL (ref 3.87–5.11)
RDW: 13.5 % (ref 11.5–15.5)
WBC: 12.4 10*3/uL — AB (ref 4.0–10.5)

## 2017-03-03 LAB — COMPREHENSIVE METABOLIC PANEL
ALT: 37 U/L (ref 14–54)
AST: 26 U/L (ref 15–41)
Albumin: 3.5 g/dL (ref 3.5–5.0)
Alkaline Phosphatase: 83 U/L (ref 38–126)
Anion gap: 8 (ref 5–15)
BUN: 13 mg/dL (ref 6–20)
CHLORIDE: 105 mmol/L (ref 101–111)
CO2: 22 mmol/L (ref 22–32)
CREATININE: 0.67 mg/dL (ref 0.44–1.00)
Calcium: 8.2 mg/dL — ABNORMAL LOW (ref 8.9–10.3)
Glucose, Bld: 229 mg/dL — ABNORMAL HIGH (ref 65–99)
POTASSIUM: 4 mmol/L (ref 3.5–5.1)
SODIUM: 135 mmol/L (ref 135–145)
Total Bilirubin: 1.6 mg/dL — ABNORMAL HIGH (ref 0.3–1.2)
Total Protein: 6.7 g/dL (ref 6.5–8.1)

## 2017-03-03 LAB — GLUCOSE, CAPILLARY
Glucose-Capillary: 191 mg/dL — ABNORMAL HIGH (ref 65–99)
Glucose-Capillary: 193 mg/dL — ABNORMAL HIGH (ref 65–99)

## 2017-03-03 LAB — PREGNANCY, URINE: PREG TEST UR: NEGATIVE

## 2017-03-03 SURGERY — GASTRECTOMY, SLEEVE, LAPAROSCOPIC
Anesthesia: General | Site: Abdomen

## 2017-03-03 MED ORDER — 0.9 % SODIUM CHLORIDE (POUR BTL) OPTIME
TOPICAL | Status: DC | PRN
  Administered 2017-03-03: 1000 mL

## 2017-03-03 MED ORDER — ROCURONIUM BROMIDE 50 MG/5ML IV SOSY
PREFILLED_SYRINGE | INTRAVENOUS | Status: DC | PRN
  Administered 2017-03-03: 48 mg via INTRAVENOUS
  Administered 2017-03-03: 2 mg via INTRAVENOUS
  Administered 2017-03-03: 20 mg via INTRAVENOUS

## 2017-03-03 MED ORDER — OXYCODONE HCL 5 MG/5ML PO SOLN
5.0000 mg | ORAL | Status: DC | PRN
  Administered 2017-03-04: 5 mg via ORAL
  Filled 2017-03-03: qty 5

## 2017-03-03 MED ORDER — GABAPENTIN 300 MG PO CAPS
300.0000 mg | ORAL_CAPSULE | ORAL | Status: AC
  Administered 2017-03-03: 300 mg via ORAL
  Filled 2017-03-03: qty 1

## 2017-03-03 MED ORDER — LIDOCAINE 2% (20 MG/ML) 5 ML SYRINGE
INTRAMUSCULAR | Status: AC
  Filled 2017-03-03: qty 10

## 2017-03-03 MED ORDER — PROPOFOL 10 MG/ML IV BOLUS
INTRAVENOUS | Status: AC
  Filled 2017-03-03: qty 20

## 2017-03-03 MED ORDER — BUPIVACAINE LIPOSOME 1.3 % IJ SUSP
INTRAMUSCULAR | Status: DC | PRN
  Administered 2017-03-03: 20 mL

## 2017-03-03 MED ORDER — SCOPOLAMINE 1 MG/3DAYS TD PT72
1.0000 | MEDICATED_PATCH | TRANSDERMAL | Status: DC
  Administered 2017-03-03: 1.5 mg via TRANSDERMAL
  Filled 2017-03-03: qty 1

## 2017-03-03 MED ORDER — SUCCINYLCHOLINE CHLORIDE 200 MG/10ML IV SOSY
PREFILLED_SYRINGE | INTRAVENOUS | Status: AC
  Filled 2017-03-03: qty 10

## 2017-03-03 MED ORDER — EPHEDRINE 5 MG/ML INJ
INTRAVENOUS | Status: AC
  Filled 2017-03-03: qty 10

## 2017-03-03 MED ORDER — SUCCINYLCHOLINE CHLORIDE 200 MG/10ML IV SOSY
PREFILLED_SYRINGE | INTRAVENOUS | Status: DC | PRN
  Administered 2017-03-03: 140 mg via INTRAVENOUS

## 2017-03-03 MED ORDER — BUPIVACAINE LIPOSOME 1.3 % IJ SUSP
20.0000 mL | Freq: Once | INTRAMUSCULAR | Status: DC
  Filled 2017-03-03: qty 20

## 2017-03-03 MED ORDER — ENOXAPARIN SODIUM 40 MG/0.4ML ~~LOC~~ SOLN
40.0000 mg | SUBCUTANEOUS | Status: DC
  Administered 2017-03-04: 40 mg via SUBCUTANEOUS
  Filled 2017-03-03: qty 0.4

## 2017-03-03 MED ORDER — PHENYLEPHRINE 40 MCG/ML (10ML) SYRINGE FOR IV PUSH (FOR BLOOD PRESSURE SUPPORT)
PREFILLED_SYRINGE | INTRAVENOUS | Status: AC
  Filled 2017-03-03: qty 10

## 2017-03-03 MED ORDER — SUGAMMADEX SODIUM 200 MG/2ML IV SOLN
INTRAVENOUS | Status: DC | PRN
  Administered 2017-03-03: 200 mg via INTRAVENOUS

## 2017-03-03 MED ORDER — APREPITANT 40 MG PO CAPS
40.0000 mg | ORAL_CAPSULE | ORAL | Status: AC
  Administered 2017-03-03: 40 mg via ORAL
  Filled 2017-03-03: qty 1

## 2017-03-03 MED ORDER — ONDANSETRON HCL 4 MG/2ML IJ SOLN
INTRAMUSCULAR | Status: AC
  Filled 2017-03-03: qty 2

## 2017-03-03 MED ORDER — ACETAMINOPHEN 325 MG PO TABS: 650.0000 mg | ORAL_TABLET | ORAL | Status: DC | PRN

## 2017-03-03 MED ORDER — HYDRALAZINE HCL 20 MG/ML IJ SOLN
10.0000 mg | INTRAMUSCULAR | Status: DC | PRN
  Filled 2017-03-03: qty 0.5

## 2017-03-03 MED ORDER — CLINDAMYCIN PHOSPHATE 900 MG/50ML IV SOLN
900.0000 mg | INTRAVENOUS | Status: AC
  Administered 2017-03-03: 900 mg via INTRAVENOUS
  Filled 2017-03-03: qty 50

## 2017-03-03 MED ORDER — HEPARIN SODIUM (PORCINE) 5000 UNIT/ML IJ SOLN
5000.0000 [IU] | INTRAMUSCULAR | Status: AC
  Administered 2017-03-03: 5000 [IU] via SUBCUTANEOUS
  Filled 2017-03-03: qty 1

## 2017-03-03 MED ORDER — MIDAZOLAM HCL 2 MG/2ML IJ SOLN
INTRAMUSCULAR | Status: AC
  Filled 2017-03-03: qty 2

## 2017-03-03 MED ORDER — EPHEDRINE SULFATE 50 MG/ML IJ SOLN
INTRAMUSCULAR | Status: DC | PRN
  Administered 2017-03-03 (×2): 10 mg via INTRAVENOUS

## 2017-03-03 MED ORDER — BUPIVACAINE HCL (PF) 0.25 % IJ SOLN
INTRAMUSCULAR | Status: AC
  Filled 2017-03-03: qty 30

## 2017-03-03 MED ORDER — ONDANSETRON HCL 4 MG/2ML IJ SOLN
INTRAMUSCULAR | Status: DC | PRN
  Administered 2017-03-03: 4 mg via INTRAVENOUS

## 2017-03-03 MED ORDER — FENTANYL CITRATE (PF) 250 MCG/5ML IJ SOLN
INTRAMUSCULAR | Status: DC | PRN
  Administered 2017-03-03: 100 ug via INTRAVENOUS
  Administered 2017-03-03: 50 ug via INTRAVENOUS

## 2017-03-03 MED ORDER — PROMETHAZINE HCL 25 MG/ML IJ SOLN: 6.2500 mg | INTRAMUSCULAR | Status: DC | PRN

## 2017-03-03 MED ORDER — ROCURONIUM BROMIDE 50 MG/5ML IV SOSY
PREFILLED_SYRINGE | INTRAVENOUS | Status: AC
  Filled 2017-03-03: qty 5

## 2017-03-03 MED ORDER — HYDROMORPHONE HCL-NACL 0.5-0.9 MG/ML-% IV SOSY
PREFILLED_SYRINGE | INTRAVENOUS | Status: AC
  Filled 2017-03-03: qty 1

## 2017-03-03 MED ORDER — LIDOCAINE 2% (20 MG/ML) 5 ML SYRINGE
INTRAMUSCULAR | Status: DC | PRN
  Administered 2017-03-03: 80 mg via INTRAVENOUS

## 2017-03-03 MED ORDER — FENTANYL CITRATE (PF) 250 MCG/5ML IJ SOLN
INTRAMUSCULAR | Status: AC
  Filled 2017-03-03: qty 5

## 2017-03-03 MED ORDER — LACTATED RINGERS IV SOLN
INTRAVENOUS | Status: DC
  Administered 2017-03-03 (×2): via INTRAVENOUS

## 2017-03-03 MED ORDER — HYDROMORPHONE HCL-NACL 0.5-0.9 MG/ML-% IV SOSY
0.2500 mg | PREFILLED_SYRINGE | INTRAVENOUS | Status: DC | PRN
  Administered 2017-03-03 (×2): 0.5 mg via INTRAVENOUS

## 2017-03-03 MED ORDER — DEXAMETHASONE SODIUM PHOSPHATE 10 MG/ML IJ SOLN
INTRAMUSCULAR | Status: DC | PRN
  Administered 2017-03-03: 8 mg via INTRAVENOUS

## 2017-03-03 MED ORDER — LIDOCAINE 2% (20 MG/ML) 5 ML SYRINGE
INTRAMUSCULAR | Status: AC
  Filled 2017-03-03: qty 5

## 2017-03-03 MED ORDER — LIDOCAINE 2% (20 MG/ML) 5 ML SYRINGE
INTRAMUSCULAR | Status: DC | PRN
  Administered 2017-03-03: 1.5 mg/kg/h via INTRAVENOUS

## 2017-03-03 MED ORDER — CLINDAMYCIN PHOSPHATE 900 MG/50ML IV SOLN
INTRAVENOUS | Status: AC
  Filled 2017-03-03: qty 50

## 2017-03-03 MED ORDER — ACETAMINOPHEN 160 MG/5ML PO SOLN
325.0000 mg | ORAL | Status: DC | PRN
  Administered 2017-03-04: 650 mg via ORAL
  Filled 2017-03-03: qty 20.3

## 2017-03-03 MED ORDER — DEXAMETHASONE SODIUM PHOSPHATE 10 MG/ML IJ SOLN
INTRAMUSCULAR | Status: AC
  Filled 2017-03-03: qty 1

## 2017-03-03 MED ORDER — SIMETHICONE 80 MG PO CHEW: 80.0000 mg | CHEWABLE_TABLET | Freq: Four times a day (QID) | ORAL | Status: DC | PRN

## 2017-03-03 MED ORDER — PANTOPRAZOLE SODIUM 40 MG IV SOLR
40.0000 mg | Freq: Every day | INTRAVENOUS | Status: DC
  Administered 2017-03-03: 40 mg via INTRAVENOUS

## 2017-03-03 MED ORDER — DEXAMETHASONE SODIUM PHOSPHATE 4 MG/ML IJ SOLN: 4.0000 mg | INTRAMUSCULAR | Status: DC

## 2017-03-03 MED ORDER — LACTATED RINGERS IR SOLN
Status: DC | PRN
  Administered 2017-03-03: 1000 mL

## 2017-03-03 MED ORDER — SODIUM CHLORIDE 0.9 % IV SOLN
INTRAVENOUS | Status: DC
  Administered 2017-03-03 – 2017-03-04 (×2): via INTRAVENOUS

## 2017-03-03 MED ORDER — CHLORHEXIDINE GLUCONATE 4 % EX LIQD: 60.0000 mL | Freq: Once | CUTANEOUS | Status: DC

## 2017-03-03 MED ORDER — SUGAMMADEX SODIUM 200 MG/2ML IV SOLN
INTRAVENOUS | Status: AC
  Filled 2017-03-03: qty 2

## 2017-03-03 MED ORDER — ONDANSETRON HCL 4 MG/2ML IJ SOLN: 4.0000 mg | INTRAMUSCULAR | Status: DC | PRN

## 2017-03-03 MED ORDER — BUPIVACAINE HCL 0.25 % IJ SOLN
INTRAMUSCULAR | Status: DC | PRN
  Administered 2017-03-03: 30 mL

## 2017-03-03 MED ORDER — PROPOFOL 10 MG/ML IV BOLUS
INTRAVENOUS | Status: DC | PRN
  Administered 2017-03-03: 200 mg via INTRAVENOUS

## 2017-03-03 MED ORDER — TRAMADOL HCL 50 MG PO TABS: 50.0000 mg | ORAL_TABLET | Freq: Four times a day (QID) | ORAL | Status: DC | PRN

## 2017-03-03 MED ORDER — PREMIER PROTEIN SHAKE
2.0000 [oz_av] | ORAL | Status: DC
  Administered 2017-03-04 (×3): 2 [oz_av] via ORAL

## 2017-03-03 MED ORDER — KETAMINE HCL 10 MG/ML IJ SOLN
INTRAMUSCULAR | Status: DC | PRN
  Administered 2017-03-03: 40 mg via INTRAVENOUS

## 2017-03-03 MED ORDER — MORPHINE SULFATE (PF) 2 MG/ML IV SOLN
1.0000 mg | INTRAVENOUS | Status: DC | PRN
  Administered 2017-03-03: 1 mg via INTRAVENOUS
  Filled 2017-03-03: qty 1

## 2017-03-03 MED ORDER — SCOPOLAMINE 1 MG/3DAYS TD PT72: 1.0000 | MEDICATED_PATCH | TRANSDERMAL | Status: DC

## 2017-03-03 MED ORDER — ACETAMINOPHEN 500 MG PO TABS
1000.0000 mg | ORAL_TABLET | ORAL | Status: AC
  Administered 2017-03-03: 1000 mg via ORAL
  Filled 2017-03-03: qty 2

## 2017-03-03 MED ORDER — MIDAZOLAM HCL 5 MG/5ML IJ SOLN
INTRAMUSCULAR | Status: DC | PRN
  Administered 2017-03-03: 2 mg via INTRAVENOUS

## 2017-03-03 SURGICAL SUPPLY — 57 items
APPLIER CLIP 5 13 M/L LIGAMAX5 (MISCELLANEOUS)
APPLIER CLIP ROT 13.4 12 LRG (CLIP)
BAG LAPAROSCOPIC 12 15 PORT 16 (BASKET) ×1 IMPLANT
BAG RETRIEVAL 12/15 (BASKET) ×2
BAG RETRIEVAL 12/15MM (BASKET) ×1
BANDAGE ADH SHEER 1  50/CT (GAUZE/BANDAGES/DRESSINGS) ×18 IMPLANT
BENZOIN TINCTURE PRP APPL 2/3 (GAUZE/BANDAGES/DRESSINGS) ×3 IMPLANT
BLADE SURG SZ11 CARB STEEL (BLADE) ×3 IMPLANT
CABLE HIGH FREQUENCY MONO STRZ (ELECTRODE) ×3 IMPLANT
CHLORAPREP W/TINT 26ML (MISCELLANEOUS) ×3 IMPLANT
CLIP APPLIE 5 13 M/L LIGAMAX5 (MISCELLANEOUS) IMPLANT
CLIP APPLIE ROT 13.4 12 LRG (CLIP) IMPLANT
CLOSURE WOUND 1/2 X4 (GAUZE/BANDAGES/DRESSINGS) ×1
COVER SURGICAL LIGHT HANDLE (MISCELLANEOUS) ×3 IMPLANT
DRAIN CHANNEL 19F RND (DRAIN) IMPLANT
ELECT REM PT RETURN 15FT ADLT (MISCELLANEOUS) ×3 IMPLANT
EVACUATOR SILICONE 100CC (DRAIN) IMPLANT
GAUZE SPONGE 4X4 12PLY STRL (GAUZE/BANDAGES/DRESSINGS) IMPLANT
GLOVE BIOGEL PI IND STRL 7.0 (GLOVE) ×1 IMPLANT
GLOVE BIOGEL PI INDICATOR 7.0 (GLOVE) ×2
GLOVE SURG SS PI 7.0 STRL IVOR (GLOVE) ×3 IMPLANT
GOWN STRL REUS W/TWL LRG LVL3 (GOWN DISPOSABLE) ×3 IMPLANT
GOWN STRL REUS W/TWL XL LVL3 (GOWN DISPOSABLE) ×9 IMPLANT
GRASPER SUT TROCAR 14GX15 (MISCELLANEOUS) ×3 IMPLANT
HANDLE STAPLE EGIA 4 XL (STAPLE) ×3 IMPLANT
HOVERMATT SINGLE USE (MISCELLANEOUS) ×3 IMPLANT
KIT BASIN OR (CUSTOM PROCEDURE TRAY) ×3 IMPLANT
MARKER SKIN DUAL TIP RULER LAB (MISCELLANEOUS) ×3 IMPLANT
NEEDLE SPNL 22GX3.5 QUINCKE BK (NEEDLE) ×3 IMPLANT
PACK CARDIOVASCULAR III (CUSTOM PROCEDURE TRAY) ×3 IMPLANT
RELOAD EGIA 45 MED/THCK PURPLE (STAPLE) IMPLANT
RELOAD EGIA 60 MED/THCK PURPLE (STAPLE) ×9 IMPLANT
RELOAD TRI 2.0 60 XTHK VAS SUL (STAPLE) ×6 IMPLANT
RELOAD TRI 60 ART MED THCK BLK (STAPLE) IMPLANT
RELOAD TRI 60 ART MED THCK PUR (STAPLE) IMPLANT
SCISSORS LAP 5X45 EPIX DISP (ENDOMECHANICALS) ×3 IMPLANT
SET IRRIG TUBING LAPAROSCOPIC (IRRIGATION / IRRIGATOR) ×3 IMPLANT
SHEARS HARMONIC ACE PLUS 45CM (MISCELLANEOUS) ×3 IMPLANT
SLEEVE GASTRECTOMY 40FR VISIGI (MISCELLANEOUS) ×3 IMPLANT
SLEEVE XCEL OPT CAN 5 100 (ENDOMECHANICALS) ×6 IMPLANT
SOLUTION ANTI FOG 6CC (MISCELLANEOUS) ×3 IMPLANT
SPONGE LAP 18X18 X RAY DECT (DISPOSABLE) ×3 IMPLANT
STRIP CLOSURE SKIN 1/2X4 (GAUZE/BANDAGES/DRESSINGS) ×2 IMPLANT
SUT ETHIBOND 0 36 GRN (SUTURE) IMPLANT
SUT ETHILON 2 0 PS N (SUTURE) IMPLANT
SUT MNCRL AB 4-0 PS2 18 (SUTURE) ×3 IMPLANT
SUT VICRYL 0 TIES 12 18 (SUTURE) ×3 IMPLANT
SYR 20CC LL (SYRINGE) ×3 IMPLANT
SYR 50ML LL SCALE MARK (SYRINGE) ×3 IMPLANT
TOWEL OR 17X26 10 PK STRL BLUE (TOWEL DISPOSABLE) ×3 IMPLANT
TOWEL OR NON WOVEN STRL DISP B (DISPOSABLE) ×3 IMPLANT
TROCAR BLADELESS 15MM (ENDOMECHANICALS) ×3 IMPLANT
TROCAR BLADELESS OPT 5 100 (ENDOMECHANICALS) ×3 IMPLANT
TUBING CONNECTING 10 (TUBING) ×4 IMPLANT
TUBING CONNECTING 10' (TUBING) ×2
TUBING ENDO SMARTCAP (MISCELLANEOUS) ×3 IMPLANT
TUBING INSUF HEATED (TUBING) ×3 IMPLANT

## 2017-03-03 NOTE — Progress Notes (Signed)
Patient walked to restroom, using IS, denies pain.  Given goals for this evening.  Including ambulating x 3 in hallway and IS use every 1 hour.  Patient states understanding. No questions at this time.

## 2017-03-03 NOTE — H&P (Signed)
History of Present Illness Rebecca Martin([name] [name] MD; 02/16/2017 4:05 PM) Patient words: pre-op sleeve.  The patient is a 36 year old female who presents for a bariatric surgery evaluation. Patient has completed all necessary workup in the preoperative phase for bariatric surgery. The patient is now ready to proceed with surgery. The patient has made moderate improvements in her diet and is exercising well and has no new medical issues or new medications.    Allergies Doristine Devoid([name], CMA; 02/16/2017 2:51 PM) Penicillin G Pot in Dextrose *PENICILLINS*   Medication History Doristine Devoid([name], CMA; 02/16/2017 2:51 PM) GlipiZIDE (Oral) Specific strength unknown - Active. MetFORMIN HCl (Oral) Specific strength unknown - Active. Medications Reconciled    Review of Systems Rebecca Martin([name] MD; 02/16/2017 4:05 PM) General Not Present- Appetite Loss, Chills, Fatigue, Fever, Night Sweats, Weight Gain and Weight Loss. Skin Not Present- Change in Wart/Mole, Dryness, Hives, Jaundice, New Lesions, Non-Healing Wounds, Rash and Ulcer. HEENT Present- Wears glasses/contact lenses. Not Present- Earache, Hearing Loss, Hoarseness, Nose Bleed, Oral Ulcers, Ringing in the Ears, Seasonal Allergies, Sinus Pain, Sore Throat, Visual Disturbances and Yellow Eyes. Respiratory Present- Snoring. Not Present- Bloody sputum, Chronic Cough, Difficulty Breathing and Wheezing. Breast Not Present- Breast Mass, Breast Pain, Nipple Discharge and Skin Changes. Cardiovascular Not Present- Chest Pain, Difficulty Breathing Lying Down, Leg Cramps, Palpitations, Rapid Heart Rate, Shortness of Breath and Swelling of Extremities. Gastrointestinal Not Present- Abdominal Pain, Bloating, Bloody Stool, Change in Bowel Habits, Chronic diarrhea, Constipation, Difficulty Swallowing, Excessive gas, Gets full quickly at meals, Hemorrhoids, Indigestion, Nausea, Rectal Pain and Vomiting. Female Genitourinary Not Present- Frequency, Nocturia, Painful  Urination, Pelvic Pain and Urgency. Musculoskeletal Not Present- Back Pain, Joint Pain, Joint Stiffness, Muscle Pain, Muscle Weakness and Swelling of Extremities. Neurological Not Present- Decreased Memory, Fainting, Headaches, Numbness, Seizures, Tingling, Tremor, Trouble walking and Weakness. Psychiatric Not Present- Anxiety, Bipolar, Change in Sleep Pattern, Depression, Fearful and Frequent crying. Endocrine Not Present- Cold Intolerance, Excessive Hunger, Hair Changes, Heat Intolerance, Hot flashes and New Diabetes. Hematology Not Present- Blood Thinners, Easy Bruising, Excessive bleeding, Gland problems, HIV and Persistent Infections.  Vitals ([name] CMA; 02/16/2017 2:51 PM) 02/16/2017 2:51 PM Weight: 257.4 lb Height: 66.75in Body Surface Area: 2.24 m Body Mass Index: 40.62 kg/m  Pulse: 71 (Regular)  BP: 116/74 (Sitting, Left Arm, Standard)       Physical Exam Rebecca Martin(Garan Frappier A. Zariana Strub MD; 02/16/2017 4:05 PM) General Mental Status-Alert. General Appearance-Cooperative. Orientation-Oriented X4. Build & Nutrition-Obese. Posture-Normal posture.  Integumentary Global Assessment Upon inspection and palpation of skin surfaces of the - Head/Face: no rashes, ulcers, lesions or evidence of photo damage. No palpable nodules or masses and Neck: no visible lesions or palpable masses.  Head and Neck Head-normocephalic, atraumatic with no lesions or palpable masses. Face Global Assessment - atraumatic. Thyroid Gland Characteristics - normal size and consistency.  Eye Eyeball - Bilateral-Extraocular movements intact. Sclera/Conjunctiva - Bilateral-No scleral icterus, No Discharge.  ENMT Nose and Sinuses External Inspection of the Nose - no deformities observed, no swelling present.  Chest and Lung Exam Palpation Palpation of the chest reveals - Non-tender. Auscultation Breath sounds - Normal.  Cardiovascular Auscultation Rhythm - Regular. Heart  Sounds - S1 WNL and S2 WNL. Carotid arteries - No Carotid bruit.  Abdomen Inspection Inspection of the abdomen reveals - No Visible peristalsis, No Abnormal pulsations and No Paradoxical movements. Palpation/Percussion Palpation and Percussion of the abdomen reveal - Soft, Non Tender, No Rebound tenderness, No Rigidity (guarding), No hepatosplenomegaly and No Palpable abdominal masses.  Peripheral Vascular Upper Extremity Palpation - Pulses bilaterally normal. Lower Extremity Palpation - Edema - Bilateral - No edema.  Neurologic Neurologic evaluation reveals -normal sensation and normal coordination.  Neuropsychiatric Mental status exam performed with findings of-able to articulate well with normal speech/language, rate, volume and coherence and thought content normal with ability to perform basic computations and apply abstract reasoning.  Musculoskeletal Normal Exam - Bilateral-Upper Extremity Strength Normal and Lower Extremity Strength Normal.    Assessment & Plan Rebecca Carl MD; 02/16/2017 4:06 PM) MORBID OBESITY (E66.01) Story: 36 yo female with morbid obesity and diabetes. She is most interested in the sleeve gastrectomy. She has now completed all preoperative requirements and is ready to proceed with sleeve gastrectomy Impression: We again discussed the details of the operation: Will be done under general anesthesia with an endotracheal tube, that it will be a laparoscopic procedure with 6 small incisions large one being 1.5-2in, that we will remove remove the short gastrics and other vessels to the greater curve of the stomach, place a large Bougie down the stomach and then remove a percent of the stomach using a linear stapler. We discussed the procedure will take approximately 1.5-2 hours. We discussed risks of VTE, staple line leak, skin infection, sleeve stenosis, incisional hernia, need for open procedure, and postoperative nausea and vomiting. Current  Plans Started TraMADol HCl 50MG , 1 (one) Tablet every six hours, as needed, #30, 02/16/2017, No Refill. Started Zofran ODT 4MG , 1 (one) Tablet every six hours, as needed, #20, 02/16/2017, No Refill. Local Order: As needed for nasuea Started Protonix 40MG , 1 (one) Tablet daily, #30, 02/16/2017, Ref. x2. Started Multivitamin Adult, 1 (one) Tablet Chewable daily, #90, 02/16/2017, Ref. x3. Local Order: Prefer Bariatric Advantage Advanced Multi EA, Celebrate MultiComplete with 18 mg Iron, or Opurity: Bypass and Sleeve Optimized Chewable

## 2017-03-03 NOTE — Interval H&P Note (Signed)
History and Physical Interval Note:  03/03/2017 11:02 AM  Rebecca Martin  has presented today for surgery, with the diagnosis of Morbid Obesity, DM II  The various methods of treatment have been discussed with the patient and family. After consideration of risks, benefits and other options for treatment, the patient has consented to  Procedure(s): LAPAROSCOPIC GASTRIC SLEEVE RESECTION, UPPER ENDO (N/A) as a surgical intervention .  The patient's history has been reviewed, patient examined, no change in status, stable for surgery.  I have reviewed the patient's chart and labs.  Questions were answered to the patient's satisfaction.     De BlanchLuke Aaron Kinsinger

## 2017-03-03 NOTE — Progress Notes (Signed)
Inpatient Diabetes Program Recommendations  AACE/ADA: New Consensus Statement on Inpatient Glycemic Control (2015)  Target Ranges:  Prepandial:   less than 140 mg/dL      Peak postprandial:   less than 180 mg/dL (1-2 hours)      Critically ill patients:  140 - 180 mg/dL   Lab Results  Component Value Date   GLUCAP 193 (H) 03/03/2017   HGBA1C 8.9 (H) 02/20/2017    Review of Glycemic Control  Diabetes history: DM2 Outpatient Diabetes medications: None Current orders for Inpatient glycemic control: None  Hx DM2 - HgbA1C - 8.9%  Inpatient Diabetes Program Recommendations:    Add Novolog 0-15 units Q4H.  Will follow while inpatient.   Thank you. Ailene Ardshonda Araiyah Cumpton, RD, LDN, CDE Inpatient Diabetes Coordinator [phone]

## 2017-03-03 NOTE — Anesthesia Postprocedure Evaluation (Signed)
Anesthesia Post Note  Patient: Rebecca Martin  Procedure(s) Performed: Procedure(s) (LRB): LAPAROSCOPIC GASTRIC SLEEVE RESECTION, UPPER ENDO (N/A)     Patient location during evaluation: PACU Anesthesia Type: General Level of consciousness: awake and alert Pain management: pain level controlled Vital Signs Assessment: post-procedure vital signs reviewed and stable Respiratory status: spontaneous breathing, nonlabored ventilation, respiratory function stable and patient connected to nasal cannula oxygen Cardiovascular status: blood pressure returned to baseline and stable Postop Assessment: no signs of nausea or vomiting Anesthetic complications: no    Last Vitals:  Vitals:   03/03/17 1330 03/03/17 1336  BP:  (!) 112/53  Pulse: (!) 56 (!) 50  Resp: 12   Temp:    SpO2: 100% 100%    Last Pain:  Vitals:   03/03/17 1315  TempSrc:   PainSc: 0-No pain                 Millena Callins S

## 2017-03-03 NOTE — Op Note (Signed)
Procedure: Upper GI endoscopy  Description of procedure: Upper GI endoscopy is performed at the completion of laparoscopic sleeve gastrectomy by Dr.  Sheliah HatchKinsinger.  The video endoscope was introduced into the upper esophagus and then passed to the EG junction at about 40 cm. The esophagus appeared normal. The gastric sleeve was entered. The sleeve was tensely distended with air while the outlet was obstructed under saline irrigation by the operating surgeon. There was no evidence of leak. The staple line was intact and without bleeding. The scope was advanced to the antrum and pylorus visualized. There was no stricture or twisting or mucosal abnormality, and particularly no narrowing noted at the incisura.  The pouch was then desufflated and the scope withdrawn.  Mariella SaaBenjamin T Kaleem Sartwell MD, FACS  03/03/2017, 12:47 PM +

## 2017-03-03 NOTE — Op Note (Signed)
Preop Diagnosis: Obesity Class III  Postop Diagnosis: same  Procedure performed: laparoscopic Sleeve Gastrectomy  Assitant: Jaclynn Guarneri  Indications:  The patient is a 36 y.o. year-old morbidly obese female who has been followed in the [facility] Clinic as an outpatient. This patient was diagnosed with morbid obesity with a BMI of Body mass index is 38.92 kg/m. and significant co-morbidities including osteoarthritis.  The patient was counseled extensively in the Bariatric Outpatient Clinic and after a thorough explanation of the risks and benefits of surgery (including death from complications, bowel leak, infection such as peritonitis and/or sepsis, internal hernia, bleeding, need for blood transfusion, bowel obstruction, organ failure, pulmonary embolus, deep venous thrombosis, wound infection, incisional hernia, skin breakdown, and others entailed on the consent form) and after a compliant diet and exercise program, the patient was scheduled for an elective laparoscopic sleeve gastrectomy.  Description of Operation:  Following informed consent, the patient was taken to the operating room and placed on the operating table in the supine position.  She had previously received prophylactic antibiotics and subcutaneous heparin for DVT prophylaxis in the pre-op holding area.  After induction of general endotracheal anesthesia by the anesthesiologist, the patient underwent placement of sequential compression devices, Foley catheter and an oro-gastric tube.  A timeout was confirmed by the surgery and anesthesia teams.  The patient was adequately padded at all pressure points and placed on a footboard to prevent slippage from the OR table during extremes of position during surgery.  She underwent a routine sterile prep and drape of her entire abdomen.    Next, A transverse incision was made under the left subcostal area and a 5mm optical viewing trocar was introduced into the peritoneal cavity.  Pneumoperitoneum was applied with a high flow and low pressure. A laparoscope was inserted to confirm placement. A extraperitoneal block was then placed at the lateral abdominal wall using exparel diluted with marcaine. 5 additional trocars were placed: 1 5mm trocar to the left of the midline. 1 additional 5mm trocar in the left lateral area, 1 12mm trocar in the right mid abdomen, and 1 5mm trocar in the right subcostal area.  The fat pad at the GE junction was incised and the gastrodiaphragmatic ligament was divided using the Harmonic scalpel. Next, a hole was created through the lesser omentum along the greater curve of the stomach to enter the lesser sac. The vessels along the greater omentum were  Then ligated and divided using the Harmonic scalpel moving towards the spleen and then short gastric vessels were ligated and divided in the same fashion to fully mobilize the fundus. The left crus was identified to ensure completion of the dissection. Next the antrum was measured and dissection continued inferiorly along the greater curve towards the pylorus and stopped 6cm from the pylorus.   A 40Fr ViSiGi dilator was placed into the esophgaus and along the lesser curve of the stomach and placed on suction. 2 non-reinforced 60mm 4-1mm tristapler(s) followed by 3 60mm 3-14mm tristaplers were used to make the resection along the antrum being sure to stay well away from the angularis by angling the jaws of the stapler towards the greater curve and later completing the resection staying along the ViSiGi and ensuring the fundus was not retained by appropriately retracting it lateral. Air was inserted through the ViSiGi to perform a leak test showing no bubbles and a neutral lie of the stomach.  The assistant then went and performed an upper endoscopy and leak test. No bubbles were  seen and the sleeve and antrum distended appropriately. The specimen was then placed in an endocatch bag and removed by the 15mm port.  The fascia of the 15mm port was closed with a 0 vicryl by suture passer. Hemostasis was ensured. Pneumoperitoneum was evacuated, all ports were removed and all incisions closed with 4-0 monocryl suture in subcuticular fashion. Steristrips and bandaids were put in place for dressing. The patient awoke from anesthesia and was brought to pacu in stable condition. All counts were correct.  Estimated blood loss: <8730ml  Specimens:  Sleeve gastrectomy  Local Anesthesia: 50 ml Exparel:0.5% Marcaine mix  Post-Op Plan:       Pain Management: PO, prn      Antibiotics: Prophylactic      Anticoagulation: Prophylactic, Starting now      Post Op Studies/Consults: Not applicable      Intended Discharge: within 48h      Intended Outpatient Follow-Up: Two Week      Intended Outpatient Studies: Not Applicable      Other: Not Applicable   De BlanchLuke Aaron Kinsinger

## 2017-03-03 NOTE — Anesthesia Preprocedure Evaluation (Signed)
Anesthesia Evaluation  Patient identified by MRN, date of birth, ID band Patient awake    Reviewed: Allergy & Precautions, NPO status , Patient's Chart, lab work & pertinent test results  Airway Mallampati: II  TM Distance: >3 FB Neck ROM: Full    Dental no notable dental hx.    Pulmonary neg pulmonary ROS,    Pulmonary exam normal breath sounds clear to auscultation       Cardiovascular negative cardio ROS Normal cardiovascular exam Rhythm:Regular Rate:Normal     Neuro/Psych negative neurological ROS  negative psych ROS   GI/Hepatic negative GI ROS, Neg liver ROS,   Endo/Other  diabetesMorbid obesity  Renal/GU negative Renal ROS  negative genitourinary   Musculoskeletal negative musculoskeletal ROS (+)   Abdominal   Peds negative pediatric ROS (+)  Hematology negative hematology ROS (+)   Anesthesia Other Findings   Reproductive/Obstetrics negative OB ROS                             Anesthesia Physical Anesthesia Plan  ASA: II  Anesthesia Plan: General   Post-op Pain Management:    Induction: Intravenous  PONV Risk Score and Plan: 2 and Ondansetron, Dexamethasone, Midazolam and Scopolamine patch - Pre-op  Airway Management Planned: Oral ETT  Additional Equipment:   Intra-op Plan:   Post-operative Plan: Extubation in OR  Informed Consent: I have reviewed the patients History and Physical, chart, labs and discussed the procedure including the risks, benefits and alternatives for the proposed anesthesia with the patient or authorized representative who has indicated his/her understanding and acceptance.   Dental advisory given  Plan Discussed with: CRNA and Surgeon  Anesthesia Plan Comments:         Anesthesia Quick Evaluation

## 2017-03-03 NOTE — Discharge Instructions (Signed)

## 2017-03-03 NOTE — Progress Notes (Signed)
CBC and Serum Creatinine drawn by lab. 

## 2017-03-03 NOTE — Anesthesia Procedure Notes (Signed)
Procedure Name: Intubation Date/Time: 03/03/2017 11:24 AM Performed by: [place] Jocelynn Gioffre C Pre-anesthesia Checklist: Patient identified, Emergency Drugs available, Suction available, Patient being monitored and Timeout performed Patient Re-evaluated:Patient Re-evaluated prior to induction Oxygen Delivery Method: Circle system utilized Preoxygenation: Pre-oxygenation with 100% oxygen Induction Type: IV induction Ventilation: Mask ventilation without difficulty Laryngoscope Size: Mac and 3 Grade View: Grade I Tube type: Oral Tube size: 7.5 mm Number of attempts: 1 Airway Equipment and Method: Stylet and Oral airway Placement Confirmation: ETT inserted through vocal cords under direct vision,  positive ETCO2 and breath sounds checked- equal and bilateral Secured at: 21 cm Tube secured with: Tape Dental Injury: Teeth and Oropharynx as per pre-operative assessment

## 2017-03-03 NOTE — Progress Notes (Signed)
CBC and Serum Creatinine results noted. 

## 2017-03-03 NOTE — Transfer of Care (Signed)
Immediate Anesthesia Transfer of Care Note  Patient: Rebecca Martin  Procedure(s) Performed: Procedure(s): LAPAROSCOPIC GASTRIC SLEEVE RESECTION, UPPER ENDO (N/A)  Patient Location: PACU  Anesthesia Type:General  Level of Consciousness: awake, alert  and oriented  Airway & Oxygen Therapy: Patient Spontanous Breathing and Patient connected to face mask oxygen  Post-op Assessment: Report given to RN and Post -op Vital signs reviewed and stable  Post vital signs: Reviewed and stable  Last Vitals:  Vitals:   03/03/17 0836  BP: (!) 163/79  Pulse: 77  Resp: 18  Temp: 36.8 C  SpO2: 100%    Last Pain:  Vitals:   03/03/17 0836  TempSrc: Oral      Patients Stated Pain Goal: 3 (03/03/17 1026)  Complications: No apparent anesthesia complications

## 2017-03-04 LAB — COMPREHENSIVE METABOLIC PANEL
ALBUMIN: 3.6 g/dL (ref 3.5–5.0)
ALK PHOS: 81 U/L (ref 38–126)
ALT: 35 U/L (ref 14–54)
AST: 18 U/L (ref 15–41)
Anion gap: 9 (ref 5–15)
BUN: 10 mg/dL (ref 6–20)
CALCIUM: 8.5 mg/dL — AB (ref 8.9–10.3)
CO2: 21 mmol/L — AB (ref 22–32)
Chloride: 104 mmol/L (ref 101–111)
Creatinine, Ser: 0.54 mg/dL (ref 0.44–1.00)
GFR calc Af Amer: >60 mL/min (ref >60)
GFR calc non Af Amer: >60 mL/min (ref >60)
GLUCOSE: 172 mg/dL — AB (ref 65–99)
Potassium: 4.3 mmol/L (ref 3.5–5.1)
Sodium: 134 mmol/L — ABNORMAL LOW (ref 135–145)
TOTAL PROTEIN: 7 g/dL (ref 6.5–8.1)
Total Bilirubin: 1.6 mg/dL — ABNORMAL HIGH (ref 0.3–1.2)

## 2017-03-04 LAB — CBC WITH DIFFERENTIAL/PLATELET
BASOS ABS: 0 10*3/uL (ref 0.0–0.1)
BASOS PCT: 0 %
Eosinophils Absolute: 0 10*3/uL (ref 0.0–0.7)
Eosinophils Relative: 0 %
HEMATOCRIT: 36.6 % (ref 36.0–46.0)
HEMOGLOBIN: 12.1 g/dL (ref 12.0–15.0)
Lymphocytes Relative: 15 %
Lymphs Abs: 2 10*3/uL (ref 0.7–4.0)
MCH: 27.1 pg (ref 26.0–34.0)
MCHC: 33.1 g/dL (ref 30.0–36.0)
MCV: 81.9 fL (ref 78.0–100.0)
MONOS PCT: 4 %
Monocytes Absolute: 0.6 10*3/uL (ref 0.1–1.0)
NEUTROS ABS: 10.8 10*3/uL — AB (ref 1.7–7.7)
NEUTROS PCT: 81 %
Platelets: 366 10*3/uL (ref 150–400)
RBC: 4.47 MIL/uL (ref 3.87–5.11)
RDW: 13.9 % (ref 11.5–15.5)
WBC: 13.4 10*3/uL — AB (ref 4.0–10.5)

## 2017-03-04 NOTE — Progress Notes (Signed)
Patient alert and oriented, pain is controlled. Patient is tolerating fluids, advanced to protein shake today, patient is tolerating well.  Reviewed Gastric sleeve discharge instructions with patient and patient is able to articulate understanding.  Provided information on BELT program, Support Group and [facility] outpatient pharmacy. All questions answered, will continue to monitor.  

## 2017-03-04 NOTE — Discharge Summary (Signed)
Physician Discharge Summary  Rebecca Martin ZOX:[id number] DOB: 05/04/1981 DOA: 03/03/2017  PCP: Kandyce Rud, MD  Admit date: 03/03/2017 Discharge date: 03/04/2017  Recommendations for Outpatient Follow-up:  1.  (include homehealth, outpatient follow-up instructions, specific recommendations for PCP to follow-up on, etc.)  Follow-up Information    Toron Bowring, De Blanch, MD. Go on 03/25/2017.   Specialty:  General Surgery Why:  at 0215 Contact information: [address] [place] [address] [place] [address] [phone]        Myria Steenbergen, De Blanch, MD Follow up.   Specialty:  General Surgery Contact information: [address] [place] [address] [place] [address] [phone]          Discharge Diagnoses:  Active Problems:   Morbid obesity (HCC)   Surgical Procedure: Laparoscopic Sleeve Gastrectomy, upper endoscopy  Discharge Condition: Good Disposition: Home  Diet recommendation: Postoperative sleeve gastrectomy diet (liquids only)  Filed Weights   03/03/17 0909  Weight: 112.7 kg (248 lb 8 oz)     Hospital Course:  The patient was admitted after undergoing laparoscopic sleeve gastrectomy. POD 0 she ambulated well. POD 1 she was started on the water diet protocol and tolerated 600 ml in the first shift. Once meeting the water amount she was advanced to bariatric protein shakes which they tolerated and were discharged home POD 1.  Treatments: surgery: laparoscopic sleeve gastrectomy  Discharge Instructions  Discharge Instructions    Ambulate hourly while awake    Complete by:  As directed    Call MD for:  difficulty breathing, headache or visual disturbances    Complete by:  As directed    Call MD for:  persistant dizziness or light-headedness    Complete by:  As directed    Call MD for:  persistant nausea and vomiting    Complete by:  As directed    Call MD for:  redness, tenderness, or signs of infection (pain, swelling, redness, odor or green/yellow  discharge around incision site)    Complete by:  As directed    Call MD for:  severe uncontrolled pain    Complete by:  As directed    Call MD for:  temperature >101 F    Complete by:  As directed    Diet bariatric full liquid    Complete by:  As directed    Discharge wound care:    Complete by:  As directed    Remove Bandaids tomorrow, ok to shower tomorrow. Steristrips may fall off in 1-3 weeks.   Incentive spirometry    Complete by:  As directed    Perform hourly while awake     Allergies as of 03/04/2017      Reactions   Penicillins Rash   PT WAS A BABY  Has patient had a PCN reaction causing immediate rash, facial/tongue/throat swelling, SOB or lightheadedness with hypotension: Yes Has patient had a PCN reaction causing severe rash involving mucus membranes or skin necrosis: Unknown Has patient had a PCN reaction that required hospitalization: Unknown Has patient had a PCN reaction occurring within the last 10 years: No If all of the above answers are "NO", then may proceed with Cephalosporin use.      Medication List    STOP taking these medications   ibuprofen 200 MG tablet Commonly known as:  ADVIL,MOTRIN     TAKE these medications   acetaminophen 500 MG tablet Commonly known as:  TYLENOL Take 1,000-2,000 mg by mouth daily as needed for headache.  Discharge Care Instructions        Start     Ordered   03/04/17 0000  Diet bariatric full liquid     03/04/17 1011   03/04/17 0000  Ambulate hourly while awake     03/04/17 1011   03/04/17 0000  Incentive spirometry    Comments:  Perform hourly while awake   03/04/17 1011   03/04/17 0000  Discharge wound care:    Comments:  Remove Bandaids tomorrow, ok to shower tomorrow. Steristrips may fall off in 1-3 weeks.   03/04/17 1011   03/04/17 0000  Call MD for:  temperature >101 F     03/04/17 1011   03/04/17 0000  Call MD for:  persistant nausea and vomiting     03/04/17 1011   03/04/17 0000  Call MD  for:  severe uncontrolled pain     03/04/17 1011   03/04/17 0000  Call MD for:  redness, tenderness, or signs of infection (pain, swelling, redness, odor or green/yellow discharge around incision site)     03/04/17 1011   03/04/17 0000  Call MD for:  difficulty breathing, headache or visual disturbances     03/04/17 1011   03/04/17 0000  Call MD for:  persistant dizziness or light-headedness     03/04/17 1011     Follow-up Information    Quentavious Rittenhouse, De BlanchLuke Aaron, MD. Go on 03/25/2017.   Specialty:  General Surgery Why:  at 0215 Contact information: [address] [place] [address] [phone]        [place], De BlanchLuke Aaron, MD Follow up.   Specialty:  General Surgery Contact information: [address] [place] [address] [phone]            The results of significant diagnostics from this hospitalization (including imaging, microbiology, ancillary and laboratory) are listed below for reference.    Significant Diagnostic Studies: No results found.  Labs: Basic Metabolic Panel:  Recent Labs Lab 03/03/17 1341 03/04/17 0510  NA 135 134*  K 4.0 4.3  CL 105 104  CO2 22 21*  GLUCOSE 229* 172*  BUN 13 10  CREATININE 0.67 0.54  CALCIUM 8.2* 8.5*   Liver Function Tests:  Recent Labs Lab 03/03/17 1341 03/04/17 0510  AST 26 18  ALT 37 35  ALKPHOS 83 81  BILITOT 1.6* 1.6*  PROT 6.7 7.0  ALBUMIN 3.5 3.6    CBC:  Recent Labs Lab 03/03/17 1341 03/04/17 0510  WBC 12.4* 13.4*  NEUTROABS 9.5* 10.8*  HGB 11.6* 12.1  HCT 34.5* 36.6  MCV 79.5 81.9  PLT 314 366    CBG:  Recent Labs Lab 03/03/17 0834 03/03/17 1308  GLUCAP 191* 193*    Active Problems:   Morbid obesity (HCC)   Time coordinating discharge: <3215min

## 2017-03-04 NOTE — Progress Notes (Signed)
Patient alert and oriented, Post op day 1.  Provided support and encouragement.  Encouraged pulmonary toilet, ambulation and small sips of liquids.  Completed 12 ounces of clear fluid, started protein this am.  All questions answered.  Will continue to monitor.

## 2017-03-04 NOTE — Progress Notes (Signed)
Pt was discharged home today. Instructions were reviewed with patient, and questions were answered. Pt was taken to main entrance via wheelchair by NT.  

## 2017-03-06 ENCOUNTER — Telehealth (HOSPITAL_COMMUNITY): Payer: Self-pay

## 2017-03-06 NOTE — Telephone Encounter (Signed)
Made discharge phone call to patient per DROP protocol. Asking the following questions.    1. Do you have someone to care for you now that you are home?  independent 2. Are you having pain now that is not relieved by your pain medication?  no 3. Are you able to drink the recommended daily amount of fluids (48 ounces minimum/day) and protein (60-80 grams/day) as prescribed by the dietitian or nutritional counselor?  35 ounces of water, 11 ounces of protein, 1 cup sugar free jello,  Roughly around 48 ounces of total fluid intake.  30 grams of protein 4. Are you taking the vitamins and minerals as prescribed?  No problems with vitamins or calcium 5. Do you have the "on call" number to contact your surgeon if you have a problem or question?  Yes [facility] main number 6. Are your incisions free of redness, swelling or drainage? (If steri strips, address that these can fall off, shower as tolerated) Incision look good, problem with pain at one site yesterday, resolved no irritation at site 7. Have your bowels moved since your surgery?  If not, are you passing gas?  Moved this morning, liquid 8. Are you up and walking 3-4 times per day?  Yes 15000 steps yesterday 9. Were you provided your discharge medications before your surgery or before you were discharged from the hospital and are you taking them without problem? yes

## 2017-03-10 ENCOUNTER — Ambulatory Visit: Payer: Self-pay

## 2017-03-12 NOTE — Telephone Encounter (Signed)
Follow up with patient after discharge.  Patient getting 60 grams + of protein in daily.  Fluids between 50 and 60 ounces.  Some problems with constipation, discussed Miralax as an option.  No other concerns at this time.  Patient reminded to follow up with [facility] 03/17/17 at 330.

## 2017-03-17 ENCOUNTER — Encounter: Payer: Managed Care, Other (non HMO) | Attending: General Surgery | Admitting: Skilled Nursing Facility1

## 2017-03-17 DIAGNOSIS — E119 Type 2 diabetes mellitus without complications: Secondary | ICD-10-CM | POA: Insufficient documentation

## 2017-03-17 DIAGNOSIS — Z6841 Body Mass Index (BMI) 40.0 and over, adult: Secondary | ICD-10-CM | POA: Diagnosis not present

## 2017-03-17 DIAGNOSIS — Z7984 Long term (current) use of oral hypoglycemic drugs: Secondary | ICD-10-CM | POA: Insufficient documentation

## 2017-03-17 DIAGNOSIS — Z713 Dietary counseling and surveillance: Secondary | ICD-10-CM | POA: Insufficient documentation

## 2017-03-18 ENCOUNTER — Encounter: Payer: Self-pay | Admitting: Skilled Nursing Facility1

## 2017-03-18 NOTE — Progress Notes (Signed)
Bariatric Class:  Appt start time: 1530 end time:  1630.  2 Week Post-Operative Nutrition Class  Patient was seen on 03/17/2017 for Post-[facility]ative Nutrition [facility]   Pt arrived late.  Surgery date: 03/03/2017 Surgery type: Sleeve Start weight at [facility]: 256.6 Weight today: 236.2  TANITA  BODY COMP RESULTS  03/17/2017   BMI (kg/m^2) 37   Fat Mass (lbs) 107   Fat Free Mass (lbs) 129.2   Total Body Water (lbs) 94.6   The following the learning objectives were met by the patient during this course:  Identifies Phase 3A (Soft, High Proteins) Dietary Goals and will begin from 2 weeks post-operatively to 2 months post-operatively  Identifies appropriate sources of fluids and proteins   States protein recommendations and appropriate sources post-operatively  Identifies the need for appropriate texture modifications, mastication, and bite sizes when consuming solids  Identifies appropriate multivitamin and calcium sources post-operatively  Describes the need for physical activity post-operatively and will follow MD recommendations  States when to call healthcare provider regarding medication questions or post-operative complications  Handouts given during class include:  Phase 3A: Soft, High Protein Diet Handout  Follow-Up Plan: Patient will follow-up at [facility] in 6 weeks for 2 month post-op nutrition visit for diet advancement per MD.

## 2017-03-21 ENCOUNTER — Encounter: Payer: Self-pay | Admitting: Emergency Medicine

## 2017-03-21 DIAGNOSIS — R51 Headache: Secondary | ICD-10-CM | POA: Diagnosis present

## 2017-03-21 LAB — URINALYSIS, COMPLETE (UACMP) WITH MICROSCOPIC
BILIRUBIN URINE: NEGATIVE
Glucose, UA: NEGATIVE mg/dL
HGB URINE DIPSTICK: NEGATIVE
KETONES UR: 80 mg/dL — AB
Leukocytes, UA: NEGATIVE
NITRITE: NEGATIVE
PROTEIN: 100 mg/dL — AB
SPECIFIC GRAVITY, URINE: 1.023 (ref 1.005–1.030)
pH: 5 (ref 5.0–8.0)

## 2017-03-21 LAB — BASIC METABOLIC PANEL
Anion gap: 12 (ref 5–15)
BUN: 13 mg/dL (ref 6–20)
CALCIUM: 9.2 mg/dL (ref 8.9–10.3)
CO2: 20 mmol/L — AB (ref 22–32)
CREATININE: 0.81 mg/dL (ref 0.44–1.00)
Chloride: 105 mmol/L (ref 101–111)
Glucose, Bld: 110 mg/dL — ABNORMAL HIGH (ref 65–99)
Potassium: 3.7 mmol/L (ref 3.5–5.1)
SODIUM: 137 mmol/L (ref 135–145)

## 2017-03-21 LAB — CBC
HCT: 39.6 % (ref 35.0–47.0)
Hemoglobin: 13.5 g/dL (ref 12.0–16.0)
MCH: 27.4 pg (ref 26.0–34.0)
MCHC: 34.2 g/dL (ref 32.0–36.0)
MCV: 80.2 fL (ref 80.0–100.0)
PLATELETS: 285 10*3/uL (ref 150–440)
RBC: 4.93 MIL/uL (ref 3.80–5.20)
RDW: 14.5 % (ref 11.5–14.5)
WBC: 10.9 10*3/uL (ref 3.6–11.0)

## 2017-03-21 NOTE — ED Triage Notes (Signed)
Patient with complaint of dizziness that started this morning and headache that started about 4 hours ago. Patient states that she recently had a gastric sleeve and concerned for dehydration. Patient states that she has not been able to drink as much water as recommended.

## 2017-03-22 ENCOUNTER — Emergency Department
Admission: EM | Admit: 2017-03-22 | Discharge: 2017-03-22 | Disposition: A | Discharge status: Home / Self Care | Payer: Managed Care, Other (non HMO) | Attending: Emergency Medicine | Admitting: Emergency Medicine

## 2017-03-23 ENCOUNTER — Telehealth: Payer: Self-pay | Admitting: Emergency Medicine

## 2017-03-23 NOTE — Telephone Encounter (Signed)
Called patient due to lwot to inquire about condition and follow up plans. Says she is a little better today.  Feels she may be constipated so took something for that.  Has put in a call to her doctor.  I explained that labs are in her chart for their review

## 2017-04-29 ENCOUNTER — Encounter: Payer: Managed Care, Other (non HMO) | Attending: General Surgery | Admitting: Registered"

## 2017-04-29 ENCOUNTER — Encounter: Payer: Self-pay | Admitting: Registered"

## 2017-04-29 DIAGNOSIS — E119 Type 2 diabetes mellitus without complications: Secondary | ICD-10-CM | POA: Diagnosis not present

## 2017-04-29 DIAGNOSIS — Z7984 Long term (current) use of oral hypoglycemic drugs: Secondary | ICD-10-CM | POA: Insufficient documentation

## 2017-04-29 DIAGNOSIS — Z713 Dietary counseling and surveillance: Secondary | ICD-10-CM | POA: Diagnosis not present

## 2017-04-29 DIAGNOSIS — Z6841 Body Mass Index (BMI) 40.0 and over, adult: Secondary | ICD-10-CM | POA: Insufficient documentation

## 2017-04-29 NOTE — Patient Instructions (Signed)
Goals:  Follow Phase 3B: High Protein + Non-Starchy Vegetables  Eat 3-6 small meals/snacks, every 3-5 hrs  Increase lean protein foods to meet 60g goal  Increase fluid intake to 64oz +  Avoid drinking 15 minutes before, during and 30 minutes after eating  Aim for >30 min of physical activity daily  - Aim to increase fluid intake with other fluids such as sugar-free popsicles, sugar-free jello, Nature's Twist, etc to obtain 64 oz of fluid daily.

## 2017-04-29 NOTE — Progress Notes (Signed)
Follow-up visit:  8 Weeks Post-Operative Sleeve gastrectomy Surgery  Medical Nutrition Therapy:  Appt start time: 8:09 end time:  8:40.  Primary concerns today: Post-operative Bariatric Surgery Nutrition Management.  Non scale victories: decreased glucose readings with medications, joined gym for the first time  Surgery date: 03/03/2017 Surgery type: sleeve gastrectomy [facility]: 256.6 lbs Weight today: 223.0 lbs Weight change: 13.2 lbs loss from 236.2 on 03/17/2017 Total weight lost: 33.6 lbs Weight loss goal: decreased A1c <7  TANITA  BODY COMP RESULTS  03/17/2017 04/29/2017   BMI (kg/m^2) 37 34.9   Fat Mass (lbs) 107 94.8   Fat Free Mass (lbs) 129.2 128.2   Total Body Water (lbs) 94.6 93.2    Pt states does not like eggs, cheese, or fish. Pt states arrives 7 min late. Pt states she has done well with cooked vegetables. Pt states she has joined a gym and enjoys going to classes. Pt states she is getting bored eating the same things on a daily basis. Pt states she is excited that her glucose values have decreased without taking medication.    Preferred Learning Style:   No preference indicated   Learning Readiness:   Ready  Change in progress  24-hr recall: B (AM): protein shake (30g) Snk (AM): greek yogurt (15g)  L (PM): deli meat (14g) or [place]aw[place]key chili ( Snk (PM): sometimes greek yogurt or leftovers  D (PM): 2 chicken tenderloins (14g) sometimes vegetables  Snk (PM): none  Fluid intake: water sometimes with flavor pack; 50-60 oz Estimated total protein intake: 60+ grams  Medications: no longer taking diabetes medications Supplementation: Flintstones with iron 2x/day + 3 Ca + biotin  CBG monitoring: 2-3x since surgery Average CBG per patient: 120-140; improvement from 200-300 Last patient reported A1c: not stated  Using straws: yes Drinking while eating: no Having you been chewing well: yes Chewing/swallowing difficulties: no Changes in vision:  no Changes to mood/headaches: no Hair loss/Changes to skin/Changes to nails: no, no, no Any difficulty focusing or concentrating: no Sweating: no Dizziness/Lightheaded: no, no Palpitations: no  Carbonated beverages: no N/V/D/C/GAS: no, no, no, yes, no Abdominal Pain: no Dumping syndrome: no Last Lap-Band fill: N/A  Recent physical activity:  Gym-cardio (weight-bearing exercises) 30-45 min, 3x/week  Progress Towards Goal(s):  In progress.  Handouts given during visit include:  Phase 3B: High Protein + NS vegetables   Nutritional Diagnosis:  Inadequate fluid intake As related to bariatric surgery post-op recommendations.  As evidenced by pt report of consuming less than 64 fluid ounces a day.    Intervention:  Nutrition education and counseling. Pt was educated and counseled on ways to increase fluid intake and ways to add a variety of protein to her daily routine.  Goals:  Follow Phase 3B: High Protein + Non-Starchy Vegetables  Eat 3-6 small meals/snacks, every 3-5 hrs  Increase lean protein foods to meet 60g goal  Increase fluid intake to 64oz +  Avoid drinking 15 minutes before, during and 30 minutes after eating  Aim for >30 min of physical activity daily - Aim to increase fluid intake with other fluids such as sugar-free popsicles, sugar-free jello, Nature's Twist, etc to obtain 64 oz of fluid daily.   Teaching Method Utilized:  Visual Auditory  Barriers to learning/adherence to lifestyle change: none  Demonstrated degree of understanding via:  Teach Back   Monitoring/Evaluation:  Dietary intake, exercise, lap band fills, and body weight. Follow up in 3 months for 5 month post-op visit.

## 2017-07-30 ENCOUNTER — Ambulatory Visit: Payer: Self-pay | Admitting: Registered"

## 2017-07-30 ENCOUNTER — Ambulatory Visit: Payer: Self-pay | Admitting: Dietician

## 2018-09-13 ENCOUNTER — Encounter (HOSPITAL_COMMUNITY): Payer: Self-pay

## 2018-12-18 IMAGING — RF DG UGI W/ KUB
7 of 13 series · 7 of 13 positions shown · non-contrast
Comparison: No recent.

CLINICAL DATA: Gastric sleeve surgery.

EXAM:
UPPER GI SERIES WITH KUB
TECHNIQUE: After obtaining a scout radiograph a routine upper GI series was
performed using thin
FLUOROSCOPY TIME:  Fluoroscopy Time:  2 minutes 12 seconds
Radiation Exposure Index (if provided by the fluoroscopic device):
75.7 mGy
Number of Acquired Spot Images: 14

[Series 1: t abdomen supine · 0.15mm/px · 1 of 1 slices shown]
[im 1/1]
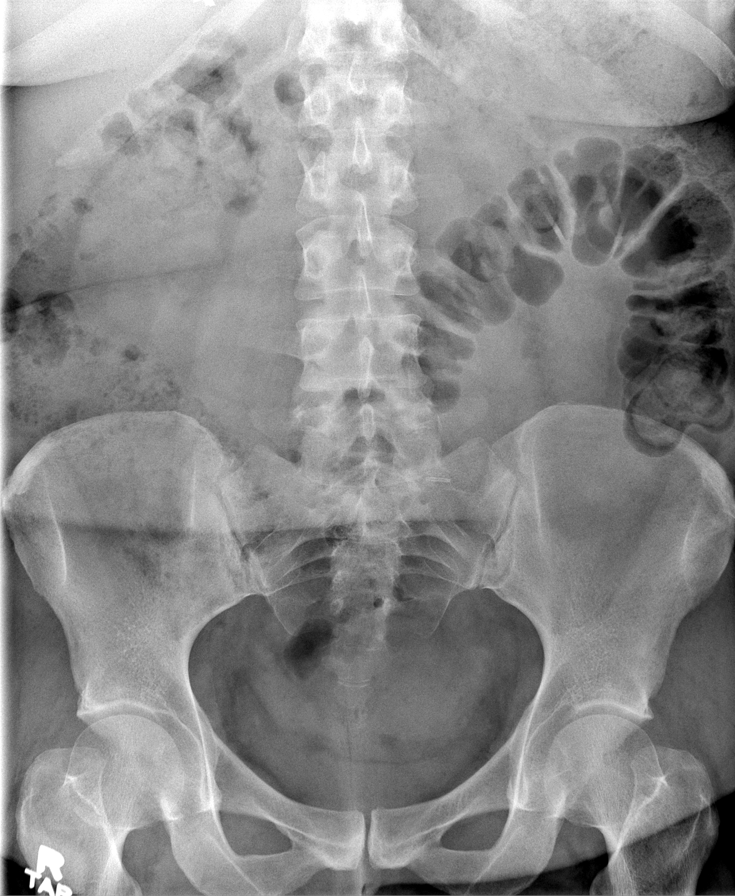

[Series 2: fluoro_barium singleshot_bw · 0.18mm/px · 1 of 1 slices shown (1 of 6)]
[im 1/1]
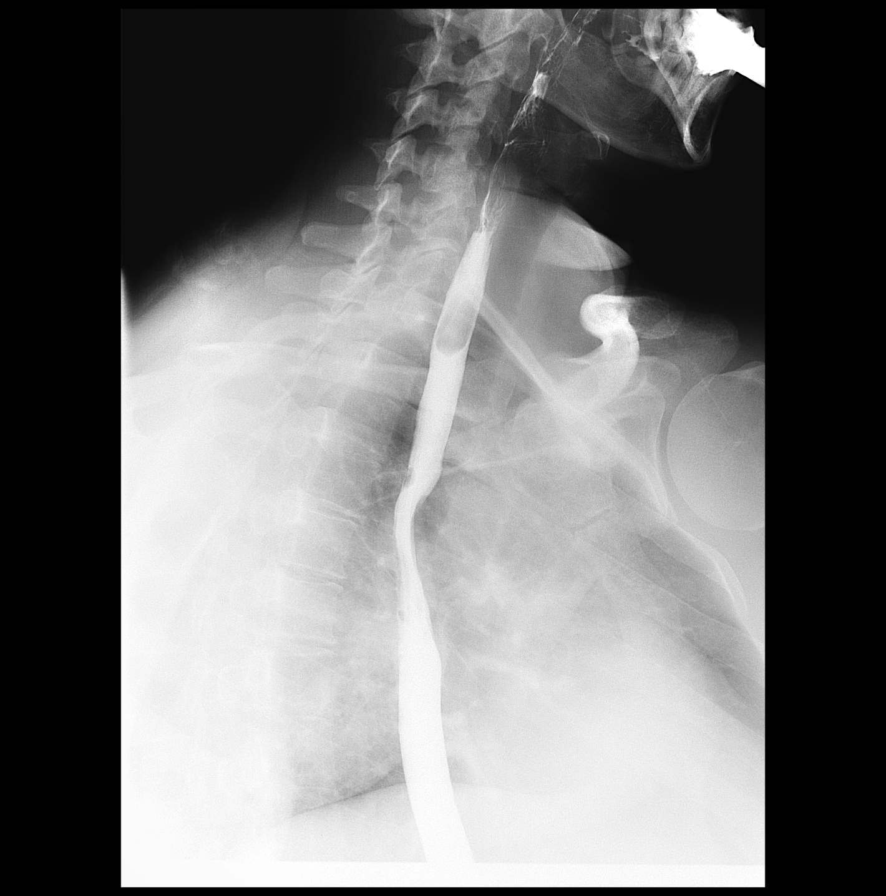

[Series 3: fluoro_barium singleshot_bw · 0.18mm/px · 1 of 1 slices shown (2 of 6)]
[im 1/1]
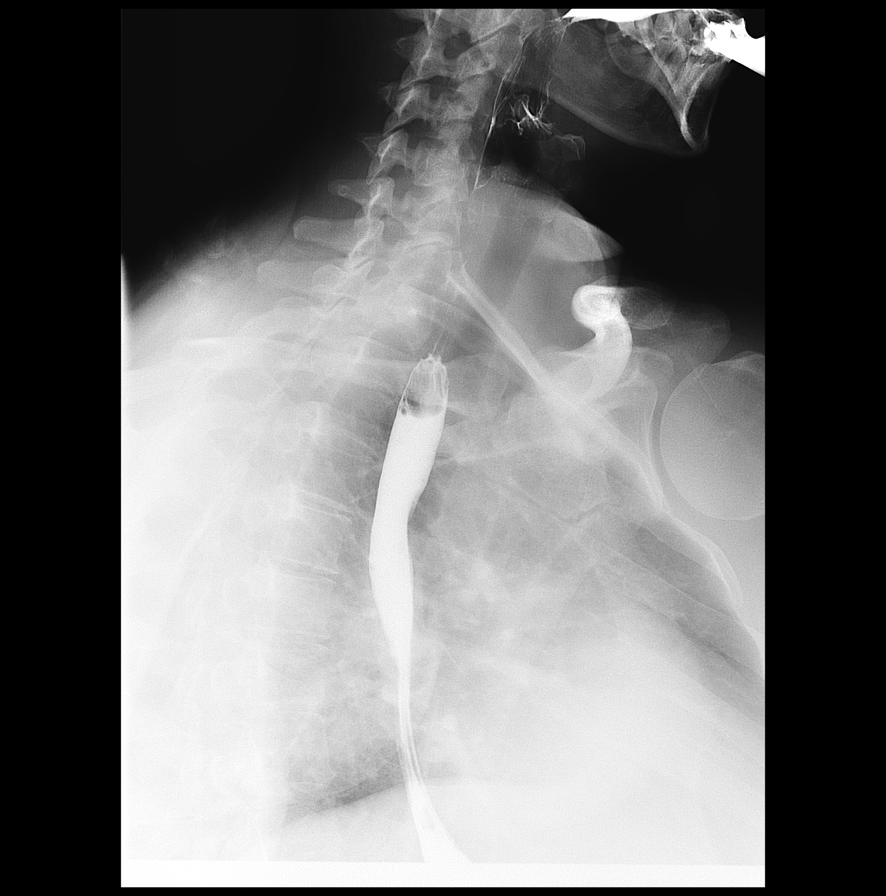

[Series 4: fluoro_barium singleshot_bw · 0.18mm/px · 1 of 1 slices shown (3 of 6)]
[im 1/1]
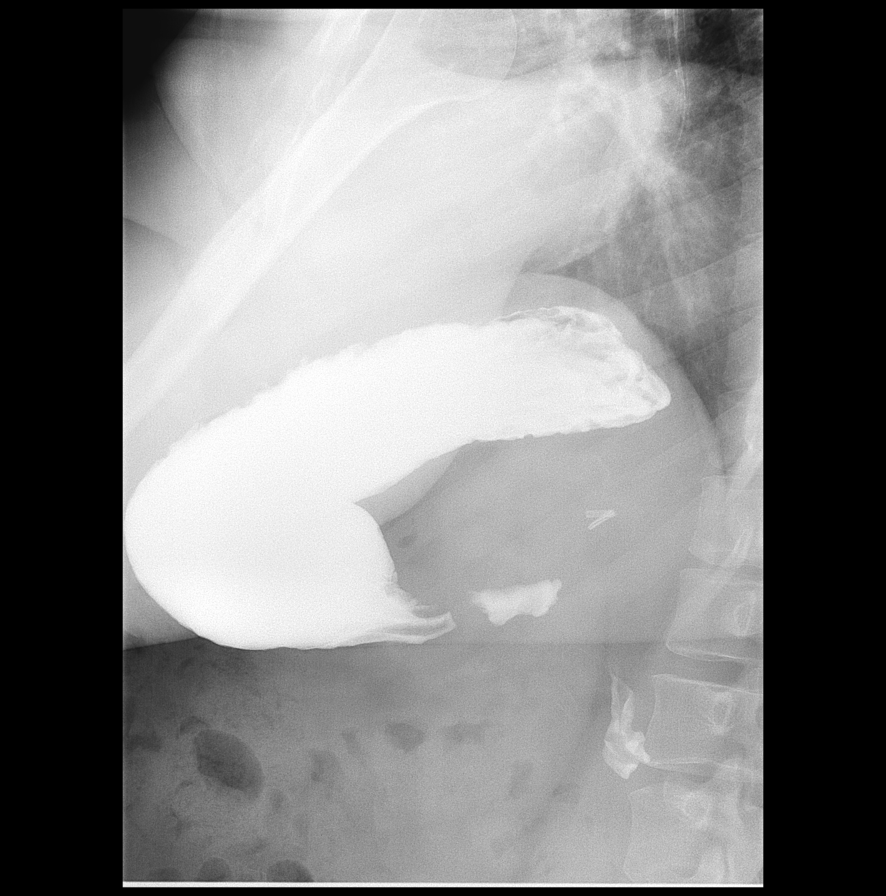

[Series 5: fluoro_barium singleshot_bw · 0.18mm/px · 1 of 1 slices shown (4 of 6)]
[im 1/1]
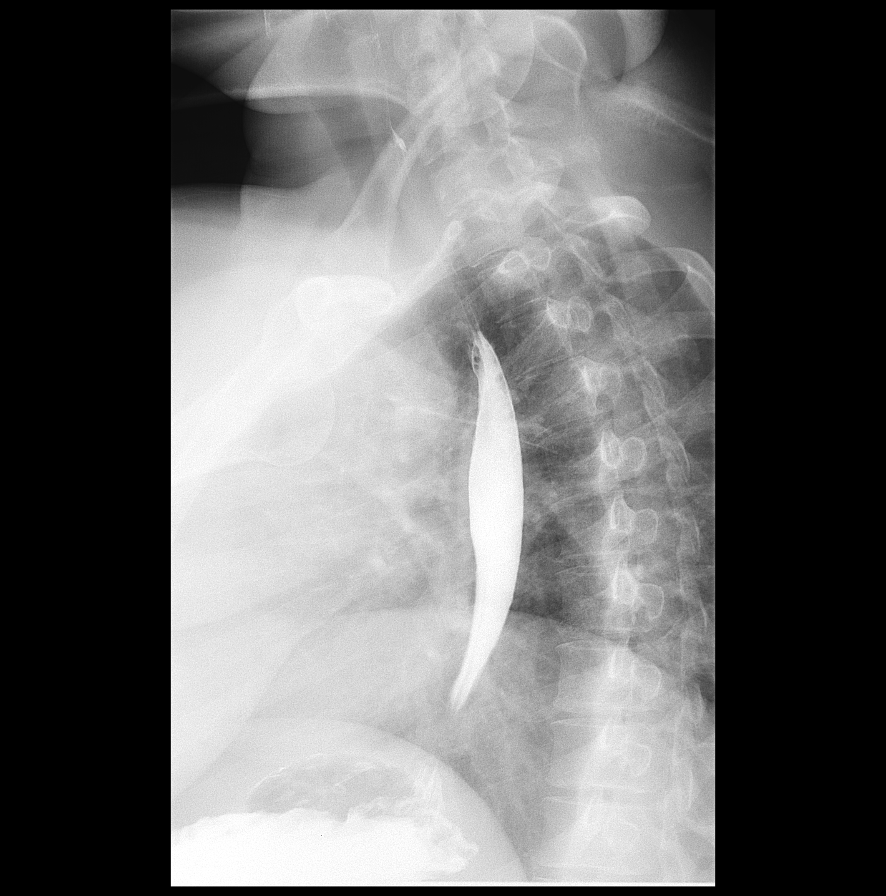

[Series 6: fluoro_barium singleshot_bw · 0.18mm/px · 1 of 1 slices shown (5 of 6)]
[im 1/1]
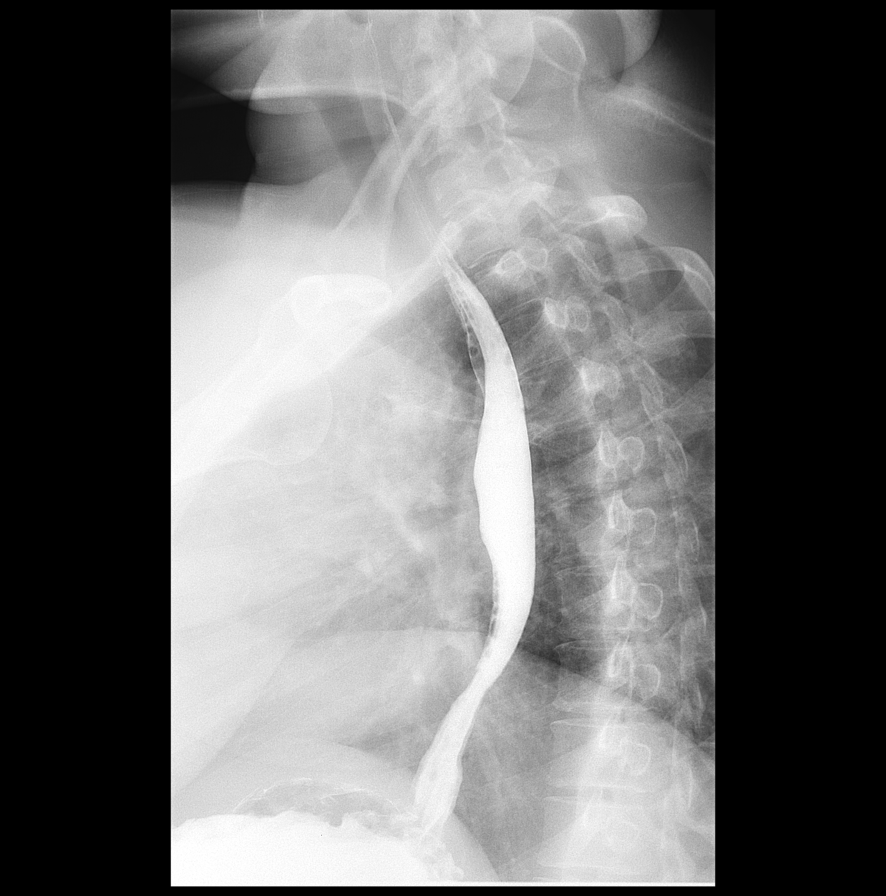

[Series 7: fluoro_barium singleshot_bw · 0.18mm/px · 1 of 1 slices shown (6 of 6)]
[im 1/1]
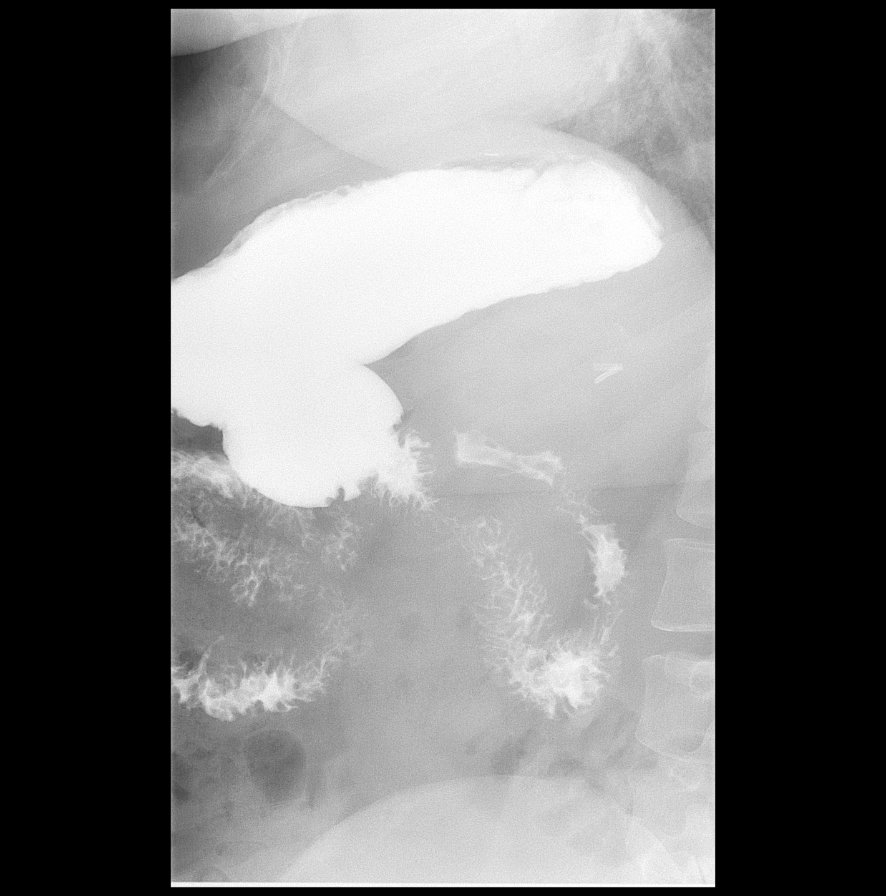

[7 of 13 positions shown; findings below may reference images not displayed]

FINDINGS: Scout film was unremarkable. Surgical clip noted over the left
abdomen. Esophagus is widely patent. Stomach appears normal. Gastric
antrum distends normally. Duodenum and C-loop are normal. No reflux.
IMPRESSION: No acute or focal abnormality identified

## 2020-09-25 ENCOUNTER — Encounter (HOSPITAL_COMMUNITY): Payer: Self-pay | Admitting: *Deleted

## 2022-10-02 ENCOUNTER — Encounter (HOSPITAL_COMMUNITY): Payer: Self-pay | Admitting: *Deleted
